# Patient Record
Sex: Female | Born: 1960 | Race: White | Hispanic: No | Marital: Married | Smoking: Former smoker
Health system: Southern US, Community
[De-identification: ages and names within clinical notes are randomized; demographics above are authoritative.]

## PROBLEM LIST (undated history)

## (undated) HISTORY — PX: WRIST FRACTURE SURGERY: SHX121

---

## 1998-06-21 ENCOUNTER — Other Ambulatory Visit: Admission: RE | Admit: 1998-06-21 | Discharge: 1998-06-21 | Payer: Self-pay | Admitting: Obstetrics and Gynecology

## 1998-08-08 ENCOUNTER — Other Ambulatory Visit: Admission: RE | Admit: 1998-08-08 | Discharge: 1998-08-08 | Payer: Self-pay | Admitting: Obstetrics and Gynecology

## 2000-01-16 ENCOUNTER — Other Ambulatory Visit: Admission: RE | Admit: 2000-01-16 | Discharge: 2000-01-16 | Payer: Self-pay | Admitting: Obstetrics and Gynecology

## 2001-01-22 ENCOUNTER — Other Ambulatory Visit: Admission: RE | Admit: 2001-01-22 | Discharge: 2001-01-22 | Payer: Self-pay | Admitting: Obstetrics and Gynecology

## 2002-03-22 ENCOUNTER — Other Ambulatory Visit: Admission: RE | Admit: 2002-03-22 | Discharge: 2002-03-22 | Payer: Self-pay | Admitting: Obstetrics and Gynecology

## 2003-04-03 ENCOUNTER — Other Ambulatory Visit: Admission: RE | Admit: 2003-04-03 | Discharge: 2003-04-03 | Payer: Self-pay | Admitting: Obstetrics and Gynecology

## 2004-05-30 ENCOUNTER — Other Ambulatory Visit: Admission: RE | Admit: 2004-05-30 | Discharge: 2004-05-30 | Payer: Self-pay | Admitting: Obstetrics and Gynecology

## 2005-06-11 ENCOUNTER — Other Ambulatory Visit: Admission: RE | Admit: 2005-06-11 | Discharge: 2005-06-11 | Payer: Self-pay | Admitting: Obstetrics and Gynecology

## 2011-01-29 ENCOUNTER — Emergency Department (INDEPENDENT_AMBULATORY_CARE_PROVIDER_SITE_OTHER): Payer: BC Managed Care – PPO

## 2011-01-29 ENCOUNTER — Emergency Department (HOSPITAL_BASED_OUTPATIENT_CLINIC_OR_DEPARTMENT_OTHER)
Admission: EM | Admit: 2011-01-29 | Discharge: 2011-01-29 | Disposition: A | Discharge status: Home / Self Care | Payer: BC Managed Care – PPO | Attending: Emergency Medicine | Admitting: Emergency Medicine

## 2011-01-29 DIAGNOSIS — S92309A Fracture of unspecified metatarsal bone(s), unspecified foot, initial encounter for closed fracture: Secondary | ICD-10-CM | POA: Insufficient documentation

## 2011-01-29 DIAGNOSIS — Y9301 Activity, walking, marching and hiking: Secondary | ICD-10-CM

## 2011-01-29 DIAGNOSIS — X500XXA Overexertion from strenuous movement or load, initial encounter: Secondary | ICD-10-CM

## 2013-04-14 ENCOUNTER — Ambulatory Visit (INDEPENDENT_AMBULATORY_CARE_PROVIDER_SITE_OTHER): Payer: BC Managed Care – PPO | Admitting: Obstetrics and Gynecology

## 2013-04-14 ENCOUNTER — Encounter: Payer: Self-pay | Admitting: Obstetrics and Gynecology

## 2013-04-14 VITALS — BP 120/70 | Ht 64.0 in | Wt 138.0 lb

## 2013-04-14 DIAGNOSIS — Z23 Encounter for immunization: Secondary | ICD-10-CM

## 2013-04-14 DIAGNOSIS — Z01419 Encounter for gynecological examination (general) (routine) without abnormal findings: Secondary | ICD-10-CM

## 2013-04-14 DIAGNOSIS — Z1211 Encounter for screening for malignant neoplasm of colon: Secondary | ICD-10-CM

## 2013-04-14 DIAGNOSIS — Z0000 Encounter for general adult medical examination without abnormal findings: Secondary | ICD-10-CM

## 2013-04-14 LAB — POCT URINALYSIS DIPSTICK
Blood, UA: NEGATIVE
Protein, UA: NEGATIVE
Urobilinogen, UA: NEGATIVE
pH, UA: 5

## 2013-04-14 LAB — COMPREHENSIVE METABOLIC PANEL
AST: 16 U/L (ref 0–37)
Albumin: 4.2 g/dL (ref 3.5–5.2)
Alkaline Phosphatase: 56 U/L (ref 39–117)
Potassium: 4.2 mEq/L (ref 3.5–5.3)
Sodium: 138 mEq/L (ref 135–145)
Total Bilirubin: 0.5 mg/dL (ref 0.3–1.2)
Total Protein: 6.7 g/dL (ref 6.0–8.3)

## 2013-04-14 LAB — CBC
Hemoglobin: 14.3 g/dL (ref 12.0–15.0)
MCH: 30.6 pg (ref 26.0–34.0)
MCHC: 33.9 g/dL (ref 30.0–36.0)
MCV: 90.4 fL (ref 78.0–100.0)
Platelets: 274 10*3/uL (ref 150–400)

## 2013-04-14 LAB — LIPID PANEL
LDL Cholesterol: 123 mg/dL — ABNORMAL HIGH (ref 0–99)
VLDL: 10 mg/dL (ref 0–40)

## 2013-04-14 MED ORDER — ESTRADIOL-LEVONORGESTREL 0.045-0.015 MG/DAY TD PTWK: 1.0000 | MEDICATED_PATCH | TRANSDERMAL | Status: DC

## 2013-04-14 NOTE — Progress Notes (Signed)
Patient ID: Danielle Shaffer, female   DOB: Oct 20, 1961, 52 y.o.   MRN: [id number] 52 y.o.   Married    Caucasian   female   G2P1011   here for annual exam.   Patient is on ClimaraPro.  Feels better on HRT.  Has tried to stop but had significant hot flashes.  Feels body shape is changing, especially when was off HRT for a brief time. Asking for a name of a Dermatology group for a nonurgent consultation.  No LMP recorded. Patient is postmenopausal.          Sexually active: yes  The current method of family planning is post menopausal status.    Exercising: weights and cardio Last mammogram:  03/2012 wnl: [facility] Last pap smear: 03/2012 wnl History of abnormal pap: Yes, 30 years ago: no treatment to cervix Smoking: no Alcohol: 1-2 beers per week Last colonoscopy: never Last Bone Density:  never Last tetanus shot: unsure Last cholesterol check: 01/2013 wnl  Hgb:                Urine: Neg   Family History  Problem Relation Age of Onset  . Hyperlipidemia Mother   . Diabetes Father   . Heart attack Father     There are no active problems to display for this patient.   History reviewed. No pertinent past medical history.  Past Surgical History  Procedure Laterality Date  . Wrist fracture surgery Left     has steel rod    Allergies: Bee venom  Current Outpatient Prescriptions  Medication Sig Dispense Refill  . CLIMARA PRO 0.045-0.015 MG/DAY Place 1 patch onto the skin once a week.       No current facility-administered medications for this visit.    ROS: Pertinent items are noted in HPI.  Social Hx:  Married.  Works in Air[employer].  Daughter 23 years old.  Exam:    BP 120/70  Ht 5\' 4"  (1.626 m)  Wt 138 lb (62.596 kg)  BMI 23.68 kg/m2   Wt Readings from Last 3 Encounters:  04/14/13 138 lb (62.596 kg)     Ht Readings from Last 3 Encounters:  04/14/13 5\' 4"  (1.626 m)    General appearance: alert, cooperative and appears stated age Head: Normocephalic, without  obvious abnormality, atraumatic Neck: no adenopathy, supple, symmetrical, trachea midline and thyroid not enlarged, symmetric, no tenderness/mass/nodules Lungs: clear to auscultation bilaterally Breasts: Inspection negative, No nipple retraction or dimpling, No nipple discharge or bleeding, No axillary or supraclavicular adenopathy, Normal to palpation without dominant masses Heart: regular rate and rhythm Abdomen: soft, non-tender;  no masses,  no organomegaly Extremities: extremities normal, atraumatic, no cyanosis or edema Skin: Skin color, texture, turgor normal. No rashes or lesions Lymph nodes: Cervical, supraclavicular, and axillary nodes normal. No abnormal inguinal nodes palpated Neurologic: Grossly normal   Pelvic: External genitalia:  no lesions              Urethra:  normal appearing urethra with no masses, tenderness or lesions              Bartholins and Skenes: normal                 Vagina: normal appearing vagina with normal color and discharge, no lesions              Cervix: normal appearance              Pap taken: yes and high risk HPV  Bimanual Exam:  Uterus:  uterus is normal size, shape, consistency and nontender                                      Adnexa: normal adnexa in size, nontender and no masses                                      Rectovaginal: Confirms rectal polyp or hemorrhoid                                      Anus:  normal sphincter tone.  A: normal menopausal exam HRT patient Rectal hemorrhoid or polyp Needs colonoscopy.    Due for tetanus vaccine.     P: mammogram at [facility].  Patient will call to make appointment.   pap smear and high risk HPV testing Rx for ClimaraPro.  See Epic orders.  Risks and benefits discussed. Referral to Dr. Loreta Ave for colonoscopy Gave patient name of [facility] so she can call to make an appointment. Lipid profile, CBC, CMP, TSH. TDap vaccine. return annually or prn     An After Visit  Summary was printed and given to the patient.

## 2013-04-14 NOTE — Addendum Note (Signed)
Addended by: Conley Simmonds on: 04/14/2013 02:02 PM   Modules accepted: Orders

## 2013-04-14 NOTE — Patient Instructions (Signed)

## 2013-04-18 ENCOUNTER — Encounter: Payer: Self-pay | Admitting: Obstetrics and Gynecology

## 2013-04-18 ENCOUNTER — Telehealth: Payer: Self-pay | Admitting: Orthopedic Surgery

## 2013-04-18 NOTE — Telephone Encounter (Signed)
Spoke with pt about appt with Dr. Loreta Ave 04-21-13 at 9:45 for consult for colonoscopy. Pt agreeable.

## 2013-04-21 LAB — IPS PAP TEST WITH HPV

## 2013-04-25 ENCOUNTER — Telehealth: Payer: Self-pay

## 2013-04-25 NOTE — Telephone Encounter (Signed)
LMOVM to call for test results. 

## 2013-04-25 NOTE — Telephone Encounter (Signed)
Patient notified of pap results and lab results.  She will begin low cholesterol diet and follow-up with PCP regarding elevated Cholesterol.

## 2013-04-25 NOTE — Telephone Encounter (Signed)
Message copied by Alphonsa Overall on Mon Apr 25, 2013  9:04 AM ------      Message from: Douglass Rivers      Created: Thu Apr 21, 2013 11:47 AM       Ascus, neg HRHPV, cotest 3y, #2 ------

## 2013-05-31 ENCOUNTER — Ambulatory Visit
Admission: RE | Admit: 2013-05-31 | Discharge: 2013-05-31 | Disposition: A | Discharge status: Home / Self Care | Payer: BC Managed Care – PPO | Source: Ambulatory Visit | Attending: Obstetrics and Gynecology | Admitting: Obstetrics and Gynecology

## 2013-05-31 DIAGNOSIS — Z01419 Encounter for gynecological examination (general) (routine) without abnormal findings: Secondary | ICD-10-CM

## 2013-09-22 ENCOUNTER — Other Ambulatory Visit: Payer: Self-pay

## 2014-04-17 ENCOUNTER — Encounter: Payer: Self-pay | Admitting: Obstetrics and Gynecology

## 2014-04-17 ENCOUNTER — Ambulatory Visit (INDEPENDENT_AMBULATORY_CARE_PROVIDER_SITE_OTHER): Payer: BC Managed Care – PPO | Admitting: Obstetrics and Gynecology

## 2014-04-17 ENCOUNTER — Ambulatory Visit: Payer: BC Managed Care – PPO | Admitting: Obstetrics and Gynecology

## 2014-04-17 VITALS — BP 120/62 | HR 76 | Ht 63.5 in | Wt 136.4 lb

## 2014-04-17 DIAGNOSIS — Z01419 Encounter for gynecological examination (general) (routine) without abnormal findings: Secondary | ICD-10-CM

## 2014-04-17 MED ORDER — ESTRADIOL-LEVONORGESTREL 0.045-0.015 MG/DAY TD PTWK: 1.0000 | MEDICATED_PATCH | TRANSDERMAL | Status: DC

## 2014-04-17 NOTE — Progress Notes (Signed)
Patient ID: Danielle Shaffer, female   DOB: 02-Jul-1961, 53 y.o.   MRN: [id number] GYNECOLOGY VISIT  PCP:   None  Referring provider:   HPI: 53 y.o.   Married  Caucasian  female   G2P1011 with Patient's last menstrual period was 11/17/2005.   here for  AEX.  Using ClimaraPro. Wants to wean off in the fall.   Daughter going to [place] in the fall. Daughter going to [place] this summer.   Hgb:  Labs at work.  Urine:  Neg  GYNECOLOGIC HISTORY: Patient's last menstrual period was 11/17/2005. Sexually active:  yes Partner preference: female Contraception:  postmenopausal  Menopausal hormone therapy: ClimaraPro patch DES exposure:  no Blood transfusions:  no Sexually transmitted diseases:    GYN procedures and prior surgeries:  no Last mammogram:  05-30-13 wnl:[facility].              Last pap and high risk HPV testing:  Ascus:neg HR HPV - 04/14/14 History of abnormal pap smear:  Yes, 31 years ago:no treatment of cervix.   OB History   Grav Para Term Preterm Abortions TAB SAB Ect Mult Living   2 1 1  1  1   1        LIFESTYLE: Exercise:   Weight training/cross training         Tobacco:   no Alcohol:     1-2 drinks per week Drug use:  no  OTHER HEALTH MAINTENANCE: Tetanus/TDap:  04-14-13 Gardisil:             n/a Influenza:           never Zostavax:          n/a  Bone density:    n/a Colonoscopy:    Had unsedated colonoscopy 1994:wnl.  Has not scheduled screening colonoscopy yet.  Cholesterol check:  Wnl:6 months ago.  Does wellness check at work.    Family History  Problem Relation Age of Onset  . Hyperlipidemia Mother   . Diabetes Father   . Heart attack Father     There are no active problems to display for this patient.  History reviewed. No pertinent past medical history.  Past Surgical History  Procedure Laterality Date  . Wrist fracture surgery Left     has steel rod    ALLERGIES: Bee venom  Current Outpatient Prescriptions  Medication Sig  Dispense Refill  . estradiol-levonorgestrel (CLIMARA PRO) 0.045-0.015 MG/DAY Place 1 patch onto the skin once a week.  4 patch  11  . loratadine-pseudoephedrine (CLARITIN-D 24-HOUR) 10-240 MG per 24 hr tablet Take 1 tablet by mouth daily.       No current facility-administered medications for this visit.     ROS:  Pertinent items are noted in HPI.  SOCIAL HISTORY:  Sales.   PHYSICAL EXAMINATION:    BP 120/62  Pulse 76  Ht 5' 3.5" (1.613 m)  Wt 136 lb 6.4 oz (61.871 kg)  BMI 23.78 kg/m2  LMP 11/17/2005   Wt Readings from Last 3 Encounters:  04/17/14 136 lb 6.4 oz (61.871 kg)  04/14/13 138 lb (62.596 kg)     Ht Readings from Last 3 Encounters:  04/17/14 5' 3.5" (1.613 m)  04/14/13 5\' 4"  (1.626 m)    General appearance: alert, cooperative and appears stated age Head: Normocephalic, without obvious abnormality, atraumatic Neck: no adenopathy, supple, symmetrical, trachea midline and thyroid not enlarged, symmetric, no tenderness/mass/nodules Lungs: clear to auscultation bilaterally Breasts: Inspection negative, No nipple retraction or  dimpling, No nipple discharge or bleeding, No axillary or supraclavicular adenopathy, Normal to palpation without dominant masses Heart: regular rate and rhythm Abdomen: soft, non-tender; no masses,  no organomegaly Extremities: extremities normal, atraumatic, no cyanosis or edema Skin: Skin color, texture, turgor normal. No rashes or lesions Lymph nodes: Cervical, supraclavicular, and axillary nodes normal. No abnormal inguinal nodes palpated Neurologic: Grossly normal  Pelvic: External genitalia:  no lesions              Urethra:  normal appearing urethra with no masses, tenderness or lesions              Bartholins and Skenes: normal                 Vagina: normal appearing vagina with normal color and discharge, no lesions              Cervix: normal appearance              Pap and high risk HPV testing done: no.            Bimanual Exam:   Uterus:  uterus is normal size, shape, consistency and nontender                                      Adnexa: normal adnexa in size, nontender and no masses                                      Rectovaginal: Confirms                                      Anus:  normal sphincter tone, no lesions  ASSESSMENT  Normal gynecologic exam. ASCUS Pap with negative HR HPV in 2014 HRT patient.  Desires to continue but may wean off in the fall 2015.   PLAN  Mammogram recommended yearly.  Pap smear and high risk HPV testing in 2017. Counseled on self breast exam, Calcium and vitamin D intake, exercise.  ClimaraPro Rx for 12 months.  I discussed risks of DVT, PE, MI, stroke and breast cancer.   Colonoscopy with [facility] this year.  Return annually or prn   An After Visit Summary was printed and given to the patient.

## 2014-04-17 NOTE — Patient Instructions (Signed)

## 2014-09-18 ENCOUNTER — Encounter: Payer: Self-pay | Admitting: Obstetrics and Gynecology

## 2015-04-19 ENCOUNTER — Ambulatory Visit (INDEPENDENT_AMBULATORY_CARE_PROVIDER_SITE_OTHER): Payer: BLUE CROSS/BLUE SHIELD | Admitting: Obstetrics and Gynecology

## 2015-04-19 ENCOUNTER — Other Ambulatory Visit: Payer: Self-pay

## 2015-04-19 ENCOUNTER — Other Ambulatory Visit: Payer: Self-pay | Admitting: Obstetrics and Gynecology

## 2015-04-19 ENCOUNTER — Encounter: Payer: Self-pay | Admitting: Obstetrics and Gynecology

## 2015-04-19 VITALS — BP 128/78 | HR 72 | Resp 16 | Ht 63.25 in | Wt 139.0 lb

## 2015-04-19 DIAGNOSIS — Z1211 Encounter for screening for malignant neoplasm of colon: Secondary | ICD-10-CM | POA: Diagnosis not present

## 2015-04-19 DIAGNOSIS — Z634 Disappearance and death of family member: Secondary | ICD-10-CM

## 2015-04-19 DIAGNOSIS — Z1231 Encounter for screening mammogram for malignant neoplasm of breast: Secondary | ICD-10-CM

## 2015-04-19 DIAGNOSIS — R5383 Other fatigue: Secondary | ICD-10-CM | POA: Diagnosis not present

## 2015-04-19 DIAGNOSIS — Z01419 Encounter for gynecological examination (general) (routine) without abnormal findings: Secondary | ICD-10-CM

## 2015-04-19 DIAGNOSIS — Z0000 Encounter for general adult medical examination without abnormal findings: Secondary | ICD-10-CM | POA: Diagnosis not present

## 2015-04-19 LAB — POCT URINALYSIS DIPSTICK
BILIRUBIN UA: NEGATIVE
Blood, UA: NEGATIVE
Glucose, UA: NEGATIVE
KETONES UA: NEGATIVE
LEUKOCYTES UA: NEGATIVE
Nitrite, UA: NEGATIVE
PROTEIN UA: NEGATIVE
Spec Grav, UA: 1.02
UROBILINOGEN UA: NEGATIVE
pH, UA: 6.5

## 2015-04-19 MED ORDER — SERTRALINE HCL 50 MG PO TABS: 50.0000 mg | ORAL_TABLET | Freq: Every day | ORAL | Status: DC

## 2015-04-19 MED ORDER — ESTRADIOL-LEVONORGESTREL 0.045-0.015 MG/DAY TD PTWK: 1.0000 | MEDICATED_PATCH | TRANSDERMAL | Status: DC

## 2015-04-19 NOTE — Patient Instructions (Signed)

## 2015-04-19 NOTE — Progress Notes (Signed)
Patient is scheduled for Bilateral Screening Mammogram  at [facility] for 04/26/15 at 0840. Patient agreeable to time/date/location.

## 2015-04-19 NOTE — Progress Notes (Signed)
Patient ID: Danielle Shaffer, female   DOB: 09-27-1961, 54 y.o.   MRN: [id number] 54 y.o. G44P1011 Married Caucasian female here for annual exam.    Stress from losses - mother and dog died.  Gave up horse that was important to family.  States it is all too much at one time.  Decreased energy.  Not exercising.  Denies suicidal ideation.   Normal cholesterol in fall 2015.  Going on a cruise for 7 days in August.   PCP:   NON PCP  Patient's last menstrual period was 11/17/2005.          Sexually active: Yes.    The current method of family planning is post menopausal status.    Exercising: Yes.    tennis & walking Smoker:  no  Health Maintenance: Pap:  04-14-13 ASCUS NEG HR HPV History of abnormal Pap:  Yes.  See above.  Also history of remote abnormal pap in 1980s.  MMG:  05-31-13 WNL Heterogeneously dense  Colonoscopy:  1994- WNL BMD:   Never TDaP:  04-14-13 Screening Labs: Employer does fasting labs, Urine today: normal.   reports that she has never smoked. She does not have any smokeless tobacco history on file. She reports that she drinks alcohol. She reports that she does not use illicit drugs.  No past medical history on file.  Past Surgical History  Procedure Laterality Date  . Wrist fracture surgery Left     has steel rod    Current Outpatient Prescriptions  Medication Sig Dispense Refill  . estradiol-levonorgestrel (CLIMARA PRO) 0.045-0.015 MG/DAY Place 1 patch onto the skin once a week. 4 patch 11  . loratadine-pseudoephedrine (CLARITIN-D 24-HOUR) 10-240 MG per 24 hr tablet Take 1 tablet by mouth daily.     No current facility-administered medications for this visit.    Family History  Problem Relation Age of Onset  . Hyperlipidemia Mother   . Diabetes Father   . Heart attack Father     ROS:  Pertinent items are noted in HPI.  Otherwise, a comprehensive ROS was negative.  Exam:   LMP 11/17/2005    General appearance: alert, cooperative and appears stated  age Head: Normocephalic, without obvious abnormality, atraumatic Neck: no adenopathy, supple, symmetrical, trachea midline and thyroid normal to inspection and palpation Lungs: clear to auscultation bilaterally Breasts: normal appearance, no masses or tenderness, Inspection negative, No nipple retraction or dimpling, No nipple discharge or bleeding, No axillary or supraclavicular adenopathy Heart: regular rate and rhythm Abdomen: soft, non-tender; bowel sounds normal; no masses,  no organomegaly Extremities: extremities normal, atraumatic, no cyanosis or edema Skin: Skin color, texture, turgor normal. No rashes or lesions Lymph nodes: Cervical, supraclavicular, and axillary nodes normal. No abnormal inguinal nodes palpated Neurologic: Grossly normal  Pelvic: External genitalia:  no lesions              Urethra:  normal appearing urethra with no masses, tenderness or lesions              Bartholins and Skenes: normal                 Vagina: normal appearing vagina with normal color and discharge, no lesions              Cervix: no lesions              Pap taken: No. Bimanual Exam:  Uterus:  normal size, contour, position, consistency, mobility, non-tender  Adnexa: normal adnexa and no mass, fullness, tenderness              Rectovaginal: Yes.  .  Confirms.              Anus:  normal sphincter tone, no lesions  Chaperone was present for exam.  Assessment:   Well woman visit with normal exam. Hx of ASCUS and negative HR HPV in 2014.  Bereavement.  Fatigue.   Plan: Yearly mammogram recommended after age 54.  Will schedule for the patient.  Recommended self breast exam.  Pap and HR HPV as above. MVI.  Labs performed.  Yes.  .   See orders.  Routine labs. Referral to GI for colonoscopy.  Refills given on medications.  Yes.  .  See orders. One month of Combipatch until mammogram back and normal.  Referral to Berniece AndreasJulie Whitt, counselor.  Card given so patient can call for  appointment.  Zoloft 50 mg daily.  #30, RF 3.  Follow up in 3 months, sooner as needed.  Follow up annually and prn.   After visit summary provided.

## 2015-04-20 LAB — COMPREHENSIVE METABOLIC PANEL
ALT: 8 U/L (ref 0–35)
AST: 12 U/L (ref 0–37)
Albumin: 4.1 g/dL (ref 3.5–5.2)
Alkaline Phosphatase: 40 U/L (ref 39–117)
BILIRUBIN TOTAL: 0.3 mg/dL (ref 0.2–1.2)
BUN: 14 mg/dL (ref 6–23)
CO2: 25 meq/L (ref 19–32)
CREATININE: 0.69 mg/dL (ref 0.50–1.10)
Calcium: 9.1 mg/dL (ref 8.4–10.5)
Chloride: 105 mEq/L (ref 96–112)
Glucose, Bld: 92 mg/dL (ref 70–99)
POTASSIUM: 4.1 meq/L (ref 3.5–5.3)
SODIUM: 137 meq/L (ref 135–145)
Total Protein: 6.6 g/dL (ref 6.0–8.3)

## 2015-04-20 LAB — CBC
HEMATOCRIT: 42.4 % (ref 36.0–46.0)
HEMOGLOBIN: 14.1 g/dL (ref 12.0–15.0)
MCH: 30.7 pg (ref 26.0–34.0)
MCHC: 33.3 g/dL (ref 30.0–36.0)
MCV: 92.2 fL (ref 78.0–100.0)
MPV: 9.9 fL (ref 8.6–12.4)
Platelets: 247 10*3/uL (ref 150–400)
RBC: 4.6 MIL/uL (ref 3.87–5.11)
RDW: 13.8 % (ref 11.5–15.5)
WBC: 5.6 10*3/uL (ref 4.0–10.5)

## 2015-04-20 LAB — TSH: TSH: 1.901 u[IU]/mL (ref 0.350–4.500)

## 2015-04-20 LAB — VITAMIN D 25 HYDROXY (VIT D DEFICIENCY, FRACTURES): VIT D 25 HYDROXY: 30 ng/mL (ref 30–100)

## 2015-04-26 ENCOUNTER — Ambulatory Visit
Admission: RE | Admit: 2015-04-26 | Discharge: 2015-04-26 | Disposition: A | Discharge status: Home / Self Care | Payer: BLUE CROSS/BLUE SHIELD | Source: Ambulatory Visit | Attending: Obstetrics and Gynecology | Admitting: Obstetrics and Gynecology

## 2015-04-26 DIAGNOSIS — Z1231 Encounter for screening mammogram for malignant neoplasm of breast: Secondary | ICD-10-CM

## 2015-04-27 ENCOUNTER — Ambulatory Visit: Payer: BC Managed Care – PPO | Admitting: Obstetrics and Gynecology

## 2015-05-16 ENCOUNTER — Other Ambulatory Visit: Payer: Self-pay | Admitting: Obstetrics and Gynecology

## 2015-05-16 NOTE — Telephone Encounter (Signed)
Medication refill request: Climara Pro Patch  Last AEX:  04/19/15 with BS Next AEX: 04/24/16 with BS Last MMG (if hormonal medication request): 04/26/15 bi-rads 1; negative Refill authorized: #4/11 rfs  Patient was given one month's worth at AEX until mammogram was back and reported. MMG done 04/26/15 bi-rads 1: neg okay to send refills in x 1 year?

## 2015-07-20 ENCOUNTER — Ambulatory Visit: Payer: BLUE CROSS/BLUE SHIELD | Admitting: Obstetrics and Gynecology

## 2015-07-24 ENCOUNTER — Telehealth: Payer: Self-pay | Admitting: Obstetrics and Gynecology

## 2015-07-24 NOTE — Telephone Encounter (Signed)
Thank you for the update.  I closed the encounter. 

## 2015-07-24 NOTE — Telephone Encounter (Signed)
Patient canceled her appointment for 3 mo reck. She states she is on my chart and will send a message to Dr. Edward Jolly. She will call back to reschedule later.

## 2015-07-25 ENCOUNTER — Ambulatory Visit: Payer: BLUE CROSS/BLUE SHIELD | Admitting: Obstetrics and Gynecology

## 2015-08-13 IMAGING — MG MM SCREEN MAMMOGRAM BILATERAL
4 series · 4 of 4 positions shown · non-contrast
Comparison: Previous exam(s).

CLINICAL DATA: Screening.

EXAM:
DIGITAL SCREENING BILATERAL MAMMOGRAM WITH CAD

[R CC]
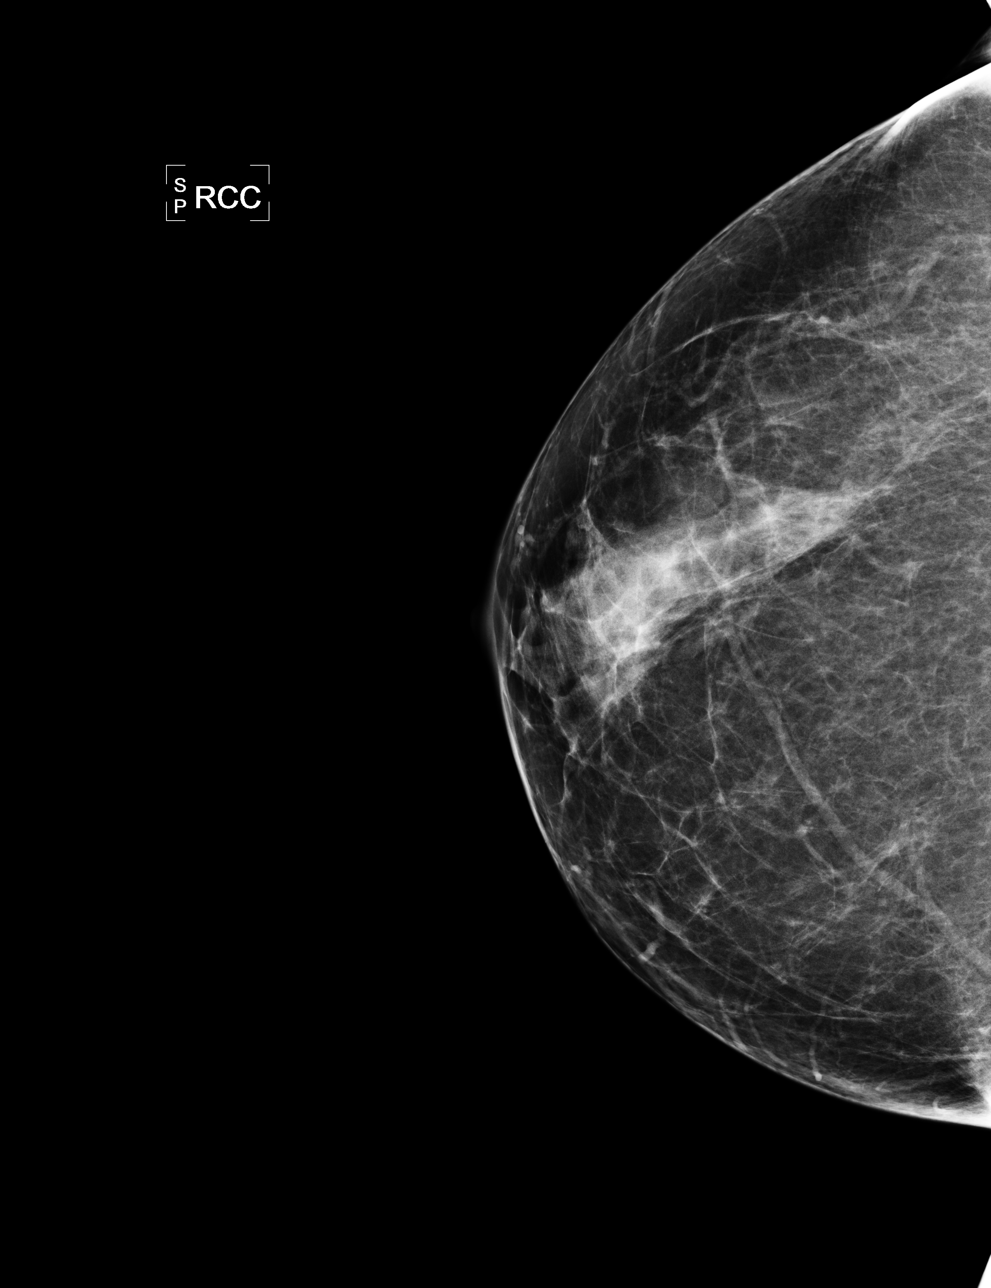

[L CC]
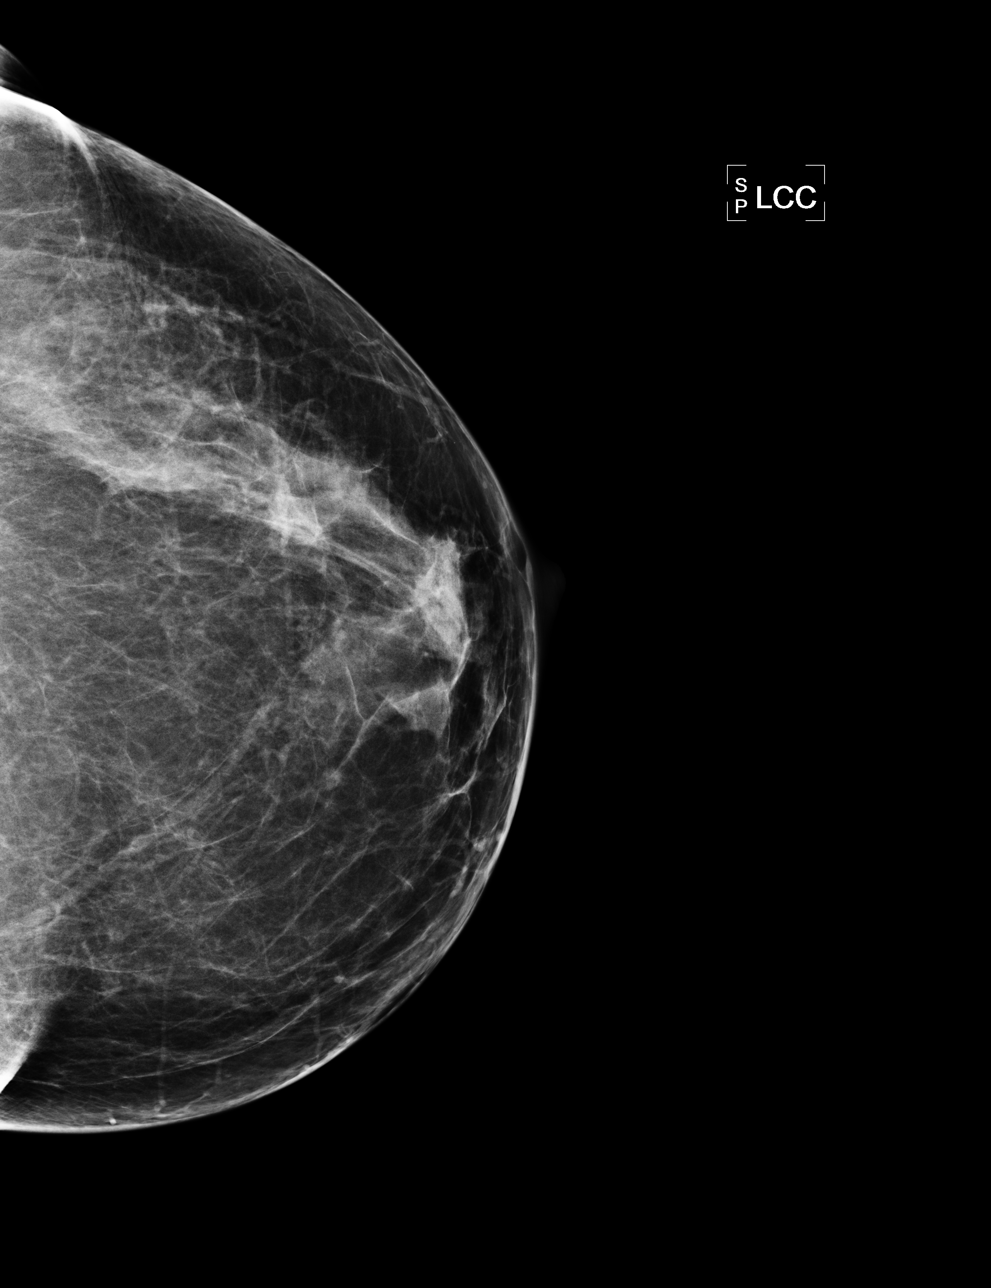

[L MLO]
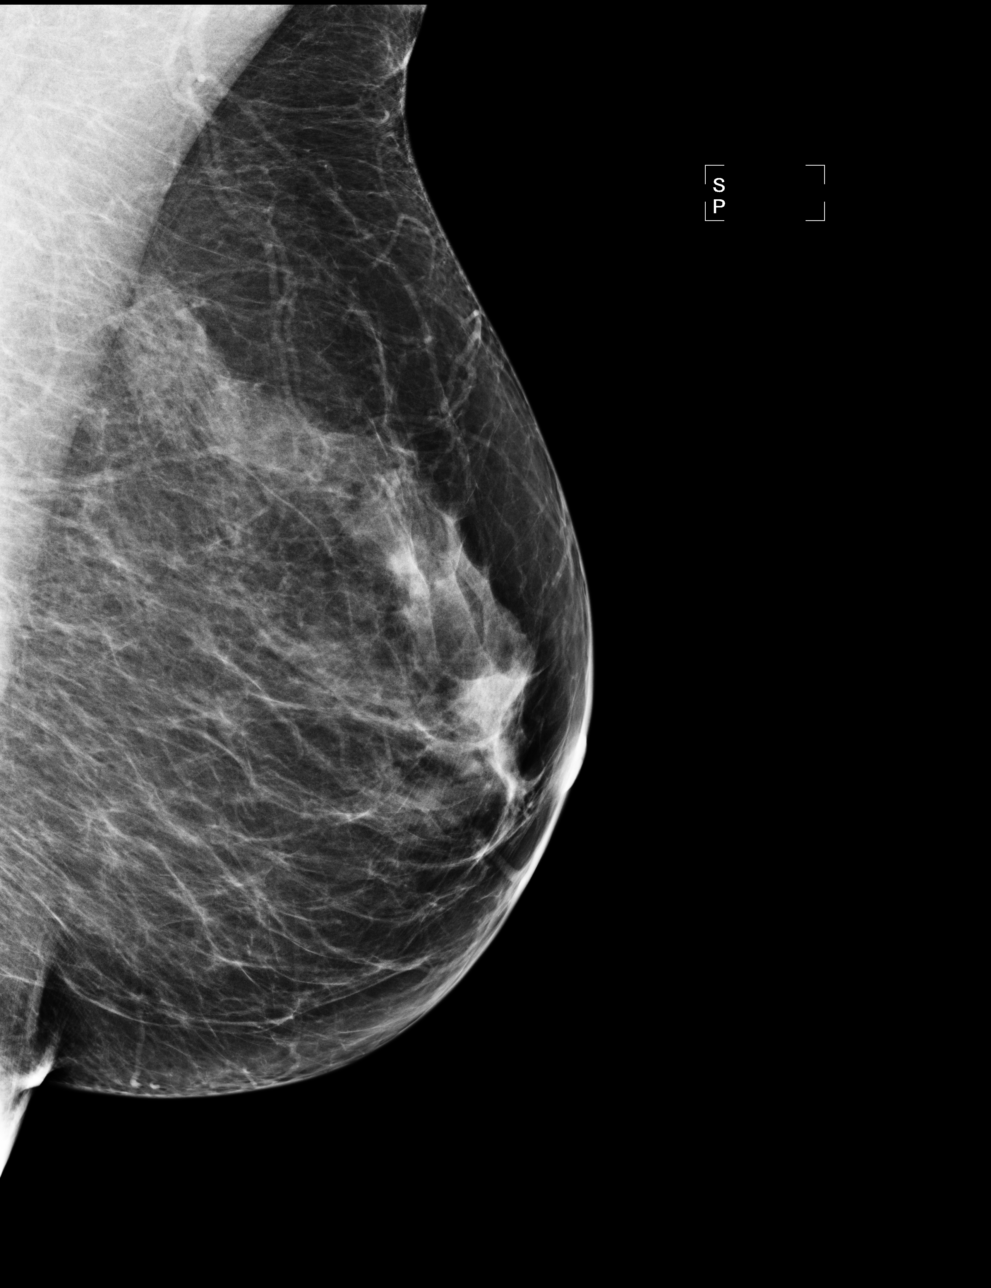

[R MLO]
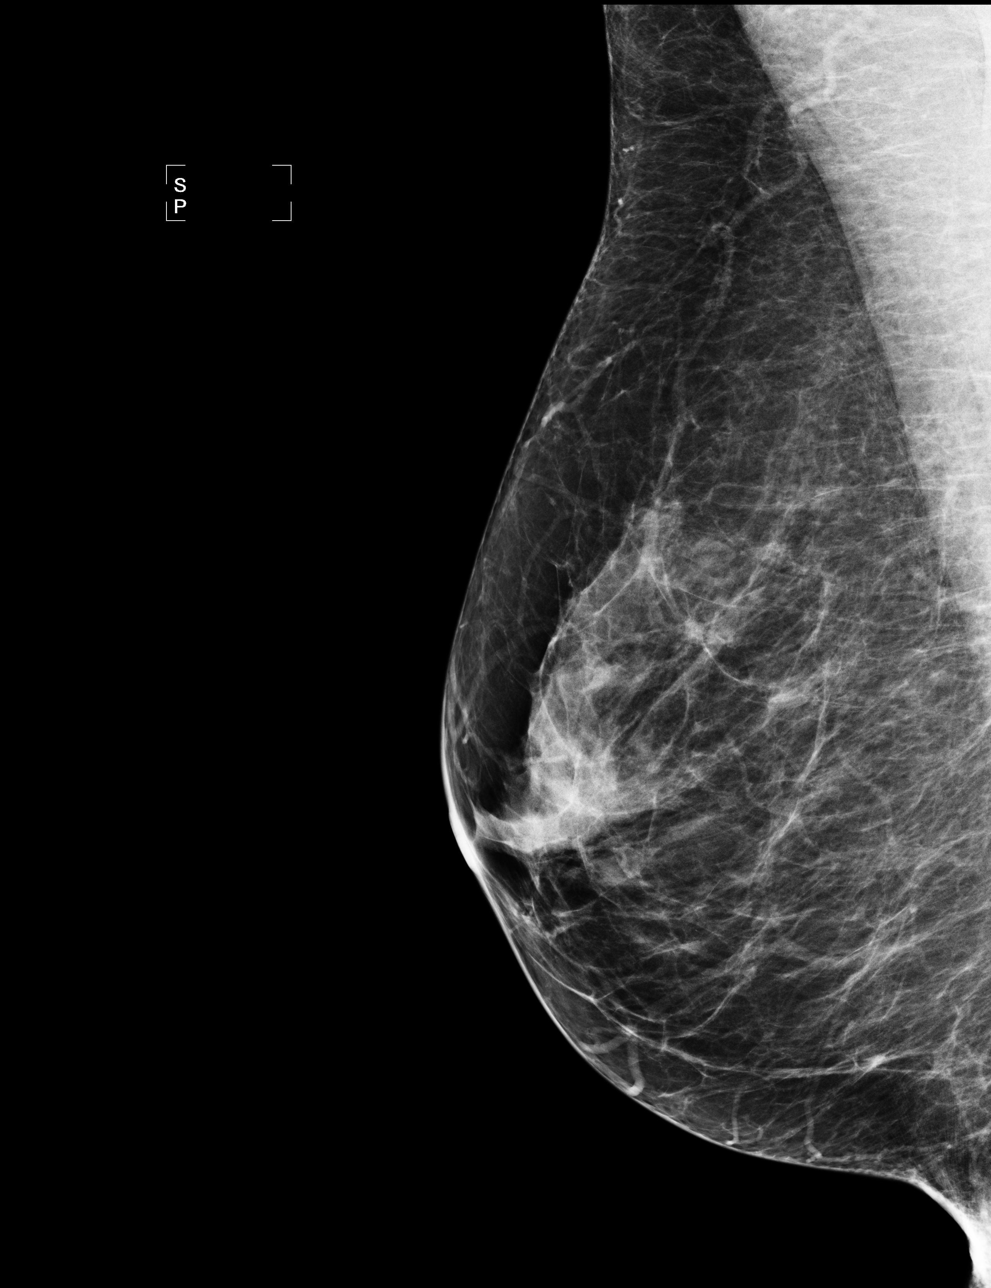

[4 of 4 positions shown; findings below may reference images not displayed]

ACR Breast Density Category b: There are scattered areas of
fibroglandular density.
FINDINGS: There are no findings suspicious for malignancy. Images were
processed with CAD.
IMPRESSION: No mammographic evidence of malignancy. A result letter of this
screening mammogram will be mailed directly to the patient.

RECOMMENDATION:
Screening mammogram in one year. (Code:AS-G-LCT)

BI-RADS CATEGORY  1: Negative.

## 2016-04-24 ENCOUNTER — Ambulatory Visit: Payer: BLUE CROSS/BLUE SHIELD | Admitting: Obstetrics and Gynecology

## 2016-05-07 ENCOUNTER — Encounter: Payer: Self-pay | Admitting: Obstetrics and Gynecology

## 2016-05-07 ENCOUNTER — Ambulatory Visit (INDEPENDENT_AMBULATORY_CARE_PROVIDER_SITE_OTHER): Payer: BLUE CROSS/BLUE SHIELD | Admitting: Obstetrics and Gynecology

## 2016-05-07 VITALS — BP 122/64 | HR 84 | Resp 22

## 2016-05-07 DIAGNOSIS — R319 Hematuria, unspecified: Secondary | ICD-10-CM | POA: Diagnosis not present

## 2016-05-07 DIAGNOSIS — Z113 Encounter for screening for infections with a predominantly sexual mode of transmission: Secondary | ICD-10-CM | POA: Diagnosis not present

## 2016-05-07 DIAGNOSIS — Z01419 Encounter for gynecological examination (general) (routine) without abnormal findings: Secondary | ICD-10-CM | POA: Diagnosis not present

## 2016-05-07 DIAGNOSIS — Z0000 Encounter for general adult medical examination without abnormal findings: Secondary | ICD-10-CM | POA: Diagnosis not present

## 2016-05-07 LAB — POCT URINALYSIS DIPSTICK
Bilirubin, UA: NEGATIVE
GLUCOSE UA: NEGATIVE
Ketones, UA: NEGATIVE
Leukocytes, UA: NEGATIVE
Nitrite, UA: NEGATIVE
Protein, UA: NEGATIVE
UROBILINOGEN UA: NEGATIVE
pH, UA: 5

## 2016-05-07 MED ORDER — ESTRADIOL-LEVONORGESTREL 0.045-0.015 MG/DAY TD PTWK: MEDICATED_PATCH | TRANSDERMAL | Status: DC

## 2016-05-07 NOTE — Patient Instructions (Signed)
Health Maintenance, Female Adopting a healthy lifestyle and getting preventive care can go a long way to promote health and wellness. Talk with your health care provider about what schedule of regular examinations is right for you. This is a good chance for you to check in with your provider about disease prevention and staying healthy. In between checkups, there are plenty of things you can do on your own. Experts have done a lot of research about which lifestyle changes and preventive measures are most likely to keep you healthy. Ask your health care provider for more information. WEIGHT AND DIET  Eat a healthy diet  Be sure to include plenty of vegetables, fruits, low-fat dairy products, and lean protein.  Do not eat a lot of foods high in solid fats, added sugars, or salt.  Get regular exercise. This is one of the most important things you can do for your health.  Most adults should exercise for at least 150 minutes each week. The exercise should increase your heart rate and make you sweat (moderate-intensity exercise).  Most adults should also do strengthening exercises at least twice a week. This is in addition to the moderate-intensity exercise.  Maintain a healthy weight  Body mass index (BMI) is a measurement that can be used to identify possible weight problems. It estimates body fat based on height and weight. Your health care provider can help determine your BMI and help you achieve or maintain a healthy weight.  For females 20 years of age and older:   A BMI below 18.5 is considered underweight.  A BMI of 18.5 to 24.9 is normal.  A BMI of 25 to 29.9 is considered overweight.  A BMI of 30 and above is considered obese.  Watch levels of cholesterol and blood lipids  You should start having your blood tested for lipids and cholesterol at 57 years of age, then have this test every 5 years.  You may need to have your cholesterol levels checked more often if:  Your lipid  or cholesterol levels are high.  You are older than 57 years of age.  You are at high risk for heart disease.  CANCER SCREENING   Lung Cancer  Lung cancer screening is recommended for adults 55-80 years old who are at high risk for lung cancer because of a history of smoking.  A yearly low-dose CT scan of the lungs is recommended for people who:  Currently smoke.  Have quit within the past 15 years.  Have at least a 30-pack-year history of smoking. A pack year is smoking an average of one pack of cigarettes a day for 1 year.  Yearly screening should continue until it has been 15 years since you quit.  Yearly screening should stop if you develop a health problem that would prevent you from having lung cancer treatment.  Breast Cancer  Practice breast self-awareness. This means understanding how your breasts normally appear and feel.  It also means doing regular breast self-exams. Let your health care provider know about any changes, no matter how small.  If you are in your 20s or 30s, you should have a clinical breast exam (CBE) by a health care provider every 1-3 years as part of a regular health exam.  If you are 40 or older, have a CBE every year. Also consider having a breast X-ray (mammogram) every year.  If you have a family history of breast cancer, talk to your health care provider about genetic screening.  If you   are at high risk for breast cancer, talk to your health care provider about having an MRI and a mammogram every year.  Breast cancer gene (BRCA) assessment is recommended for women who have family members with BRCA-related cancers. BRCA-related cancers include:  Breast.  Ovarian.  Tubal.  Peritoneal cancers.  Results of the assessment will determine the need for genetic counseling and BRCA1 and BRCA2 testing. Cervical Cancer Your health care provider may recommend that you be screened regularly for cancer of the pelvic organs (ovaries, uterus, and  vagina). This screening involves a pelvic examination, including checking for microscopic changes to the surface of your cervix (Pap test). You may be encouraged to have this screening done every 3 years, beginning at age 21.  For women ages 30-65, health care providers may recommend pelvic exams and Pap testing every 3 years, or they may recommend the Pap and pelvic exam, combined with testing for human papilloma virus (HPV), every 5 years. Some types of HPV increase your risk of cervical cancer. Testing for HPV may also be done on women of any age with unclear Pap test results.  Other health care providers may not recommend any screening for nonpregnant women who are considered low risk for pelvic cancer and who do not have symptoms. Ask your health care provider if a screening pelvic exam is right for you.  If you have had past treatment for cervical cancer or a condition that could lead to cancer, you need Pap tests and screening for cancer for at least 20 years after your treatment. If Pap tests have been discontinued, your risk factors (such as having a new sexual partner) need to be reassessed to determine if screening should resume. Some women have medical problems that increase the chance of getting cervical cancer. In these cases, your health care provider may recommend more frequent screening and Pap tests. Colorectal Cancer  This type of cancer can be detected and often prevented.  Routine colorectal cancer screening usually begins at 57 years of age and continues through 57 years of age.  Your health care provider may recommend screening at an earlier age if you have risk factors for colon cancer.  Your health care provider may also recommend using home test kits to check for hidden blood in the stool.  A small camera at the end of a tube can be used to examine your colon directly (sigmoidoscopy or colonoscopy). This is done to check for the earliest forms of colorectal  cancer.  Routine screening usually begins at age 50.  Direct examination of the colon should be repeated every 5-10 years through 57 years of age. However, you may need to be screened more often if early forms of precancerous polyps or small growths are found. Skin Cancer  Check your skin from head to toe regularly.  Tell your health care provider about any new moles or changes in moles, especially if there is a change in a mole's shape or color.  Also tell your health care provider if you have a mole that is larger than the size of a pencil eraser.  Always use sunscreen. Apply sunscreen liberally and repeatedly throughout the day.  Protect yourself by wearing long sleeves, pants, a wide-brimmed hat, and sunglasses whenever you are outside. HEART DISEASE, DIABETES, AND HIGH BLOOD PRESSURE   High blood pressure causes heart disease and increases the risk of stroke. High blood pressure is more likely to develop in:  People who have blood pressure in the high end   of the normal range (130-139/85-89 mm Hg).  People who are overweight or obese.  People who are African American.  If you are 38-23 years of age, have your blood pressure checked every 3-5 years. If you are 61 years of age or older, have your blood pressure checked every year. You should have your blood pressure measured twice--once when you are at a hospital or clinic, and once when you are not at a hospital or clinic. Record the average of the two measurements. To check your blood pressure when you are not at a hospital or clinic, you can use:  An automated blood pressure machine at a pharmacy.  A home blood pressure monitor.  If you are between 45 years and 39 years old, ask your health care provider if you should take aspirin to prevent strokes.  Have regular diabetes screenings. This involves taking a blood sample to check your fasting blood sugar level.  If you are at a normal weight and have a low risk for diabetes,  have this test once every three years after 57 years of age.  If you are overweight and have a high risk for diabetes, consider being tested at a younger age or more often. PREVENTING INFECTION  Hepatitis B  If you have a higher risk for hepatitis B, you should be screened for this virus. You are considered at high risk for hepatitis B if:  You were born in a country where hepatitis B is common. Ask your health care provider which countries are considered high risk.  Your parents were born in a high-risk country, and you have not been immunized against hepatitis B (hepatitis B vaccine).  You have HIV or AIDS.  You use needles to inject street drugs.  You live with someone who has hepatitis B.  You have had sex with someone who has hepatitis B.  You get hemodialysis treatment.  You take certain medicines for conditions, including cancer, organ transplantation, and autoimmune conditions. Hepatitis C  Blood testing is recommended for:  Everyone born from [birth year] through 1967.  Anyone with known risk factors for hepatitis C. Sexually transmitted infections (STIs)  You should be screened for sexually transmitted infections (STIs) including gonorrhea and chlamydia if:  You are sexually active and are younger than 57 years of age.  You are older than 57 years of age and your health care provider tells you that you are at risk for this type of infection.  Your sexual activity has changed since you were last screened and you are at an increased risk for chlamydia or gonorrhea. Ask your health care provider if you are at risk.  If you do not have HIV, but are at risk, it may be recommended that you take a prescription medicine daily to prevent HIV infection. This is called pre-exposure prophylaxis (PrEP). You are considered at risk if:  You are sexually active and do not regularly use condoms or know the HIV status of your partner(s).  You take drugs by injection.  You are sexually  active with a partner who has HIV. Talk with your health care provider about whether you are at high risk of being infected with HIV. If you choose to begin PrEP, you should first be tested for HIV. You should then be tested every 3 months for as long as you are taking PrEP.  PREGNANCY   If you are premenopausal and you may become pregnant, ask your health care provider about preconception counseling.  If you may  become pregnant, take 400 to 800 micrograms (mcg) of folic acid every day.  If you want to prevent pregnancy, talk to your health care provider about birth control (contraception). OSTEOPOROSIS AND MENOPAUSE   Osteoporosis is a disease in which the bones lose minerals and strength with aging. This can result in serious bone fractures. Your risk for osteoporosis can be identified using a bone density scan.  If you are 61 years of age or older, or if you are at risk for osteoporosis and fractures, ask your health care provider if you should be screened.  Ask your health care provider whether you should take a calcium or vitamin D supplement to lower your risk for osteoporosis.  Menopause may have certain physical symptoms and risks.  Hormone replacement therapy may reduce some of these symptoms and risks. Talk to your health care provider about whether hormone replacement therapy is right for you.  HOME CARE INSTRUCTIONS   Schedule regular health, dental, and eye exams.  Stay current with your immunizations.   Do not use any tobacco products including cigarettes, chewing tobacco, or electronic cigarettes.  If you are pregnant, do not drink alcohol.  If you are breastfeeding, limit how much and how often you drink alcohol.  Limit alcohol intake to no more than 1 drink per day for nonpregnant women. One drink equals 12 ounces of beer, 5 ounces of wine, or 1 ounces of hard liquor.  Do not use street drugs.  Do not share needles.  Ask your health care provider for help if  you need support or information about quitting drugs.  Tell your health care provider if you often feel depressed.  Tell your health care provider if you have ever been abused or do not feel safe at home.   This information is not intended to replace advice given to you by your health care provider. Make sure you discuss any questions you have with your health care provider.   Document Released: 05/19/2011 Document Revised: 11/24/2014 Document Reviewed: 10/05/2013 Elsevier Interactive Patient Education [employer].

## 2016-05-07 NOTE — Progress Notes (Signed)
Patient ID: Danielle Shaffer, female   DOB: 09/24/1961, 55 y.o.   MRN: [id number] 55 y.o. [id number] Married Caucasian female here for annual exam.    Working on weight loss.   On ClimaraPro.  Would like to continue.  Still having hot flashes if she stops.  Had right LE swelling 6 weeks ago.  Noted while she went to try on shoes. Had a doppler ultrasound which was negative for DVT.  Does labs at work once a year.  [facility] [place]  Patient's last menstrual period was 11/17/2005.           Sexually active: Yes.   female The current method of family planning is post menopausal status.    Exercising: Yes.    tennis, weights and treadmill. Smoker:  no  Health Maintenance: Pap:  04-14-13 ASCUS:Neg History of abnormal Pap:  Yes, Hx abnormal pap 1984 but no treatment to cervix.  04-14-13 ASCUS:Neg HR HPV MMG:  04-26-15 Density B/Neg/BiR[facility] Colonoscopy:  12/2015 normal with [facility];next due 12/2020 BMD:   n/a  Result  n/a TDaP:  04-14-13 Gardasil:   N/A HIV:  Negative in pregnancy. Hep C:  Today. Screening Labs:  Hb today: Labs thru employer, Urine today: 2+RBCs--asymptomatic   reports that she has never smoked. She has never used smokeless tobacco. She reports that she drinks about 1.8 - 3.0 oz of alcohol per week. She reports that she does not use illicit drugs.  History reviewed. No pertinent past medical history.  Past Surgical History  Procedure Laterality Date  . Wrist fracture surgery Left     has steel rod    Current Outpatient Prescriptions  Medication Sig Dispense Refill  . estradiol-levonorgestrel (CLIMARA PRO) 0.045-0.015 MG/DAY PLACE 1 PATCH ONTO THE SKIN ONCE A WEEK. 4 patch 11  . loratadine-pseudoephedrine (CLARITIN-D 24-HOUR) 10-240 MG per 24 hr tablet Take 1 tablet by mouth daily.     No current facility-administered medications for this visit.    Family History  Problem Relation Age of Onset  . Hyperlipidemia Mother   . Dementia Mother      Dec age 57  . Diabetes Father   . Heart attack Father     ROS:  Pertinent items are noted in HPI.  Otherwise, a comprehensive ROS was negative.  Exam:   BP 122/64 mmHg  Pulse 84  Resp 22  LMP 11/17/2005    General appearance: alert, cooperative and appears stated age Head: Normocephalic, without obvious abnormality, atraumatic Neck: no adenopathy, supple, symmetrical, trachea midline and thyroid normal to inspection and palpation Lungs: clear to auscultation bilaterally Breasts: normal appearance, no masses or tenderness, Inspection negative, No nipple retraction or dimpling, No nipple discharge or bleeding, No axillary or supraclavicular adenopathy Heart: regular rate and rhythm Abdomen: soft, non-tender; no masses, no organomegaly Extremities: extremities normal, atraumatic, no cyanosis or edema Skin: Skin color, texture, turgor normal. No rashes or lesions Lymph nodes: Cervical, supraclavicular, and axillary nodes normal. No abnormal inguinal nodes palpated Neurologic: Grossly normal  Pelvic: External genitalia:  no lesions              Urethra:  normal appearing urethra with no masses, tenderness or lesions              Bartholins and Skenes: normal                 Vagina: normal appearing vagina with normal color and discharge, no lesions  Cervix: no lesions              Pap taken: Yes.   Bimanual Exam:  Uterus:  normal size, contour, position, consistency, mobility, non-tender              Adnexa: normal adnexa and no mass, fullness, tenderness              Rectal exam: Yes.  .  Confirms.              Anus:  normal sphincter tone, no lesions  Chaperone was present for exam.  Assessment:   Well woman visit with normal exam. Remote hx abnormal pap.  HRT patient.  Microscopic hematuria.  Plan: Yearly mammogram recommended after age 46.  Patient will schedule. Recommended self breast exam.  Pap and HR HPV as above. Discussed Calcium, Vitamin D,  regular exercise program including cardiovascular and weight bearing exercise. Labs performed.  Yes.  .   See orders.  Hep C.  Urine micro and cx. Prescription medication(s) given.  Yes.  .  See orders.  ClimaraPro Rx for one year.  Discussed benefits and risks - DVT, PE, MI, stroke, and breast cancer. Follow up annually and prn.      After visit summary provided.

## 2016-05-08 LAB — URINALYSIS, MICROSCOPIC ONLY
BACTERIA UA: NONE SEEN [HPF]
CASTS: NONE SEEN [LPF]
CRYSTALS: NONE SEEN [HPF]
RBC / HPF: NONE SEEN RBC/HPF (ref <=2)
Squamous Epithelial / LPF: NONE SEEN [HPF] (ref <=5)
WBC, UA: NONE SEEN WBC/HPF (ref <=5)
YEAST: NONE SEEN [HPF]

## 2016-05-08 LAB — HEPATITIS C ANTIBODY: HCV AB: NEGATIVE

## 2016-05-08 LAB — URINE CULTURE

## 2016-05-09 LAB — IPS PAP TEST WITH HPV

## 2016-05-24 ENCOUNTER — Other Ambulatory Visit: Payer: Self-pay | Admitting: Obstetrics and Gynecology

## 2016-05-26 NOTE — Telephone Encounter (Signed)
Medication refill request: Climara  Last AEX:  05/07/16  Next AEX: 05/27/17 Dr. Edward JollySilva  Last MMG (if hormonal medication request): 04/26/15 BIRADS1:neg Refill authorized: 05/07/16 [facility]

## 2017-05-14 ENCOUNTER — Other Ambulatory Visit: Payer: Self-pay | Admitting: Obstetrics and Gynecology

## 2017-05-14 NOTE — Telephone Encounter (Signed)
I have attempted to contact this patient by phone with the following results: left message to return call to Danielle Shaffer at [phone] on answering machine (mobile per [facility]).  Name verified in voicemail. Advised call was regarding refill request for Climara. [phone] (Mobile)

## 2017-05-14 NOTE — Telephone Encounter (Signed)
[facility] Last filled - 05/07/16, #4 with 11 Refills Last AEX - 05/07/16 Next AEX - 05/27/17 Last MMG - 04/26/15, patient is not scheduled for follow up at [facility] at this time.  Please advise refills.

## 2017-05-27 ENCOUNTER — Ambulatory Visit: Payer: BLUE CROSS/BLUE SHIELD | Admitting: Obstetrics and Gynecology

## 2017-06-01 ENCOUNTER — Encounter: Payer: Self-pay | Admitting: Obstetrics and Gynecology

## 2017-06-01 ENCOUNTER — Ambulatory Visit (INDEPENDENT_AMBULATORY_CARE_PROVIDER_SITE_OTHER): Payer: BLUE CROSS/BLUE SHIELD | Admitting: Obstetrics and Gynecology

## 2017-06-01 VITALS — BP 118/72 | HR 80 | Resp 16 | Ht 63.25 in | Wt 138.0 lb

## 2017-06-01 DIAGNOSIS — Z01419 Encounter for gynecological examination (general) (routine) without abnormal findings: Secondary | ICD-10-CM

## 2017-06-01 NOTE — Patient Instructions (Addendum)
Please send me a copy of your wellness labs from work.    EXERCISE AND DIET:  We recommended that you start or continue a regular exercise program for good health. Regular exercise means any activity that makes your heart beat faster and makes you sweat.  We recommend exercising at least 30 minutes per day at least 3 days a week, preferably 4 or 5.  We also recommend a diet low in fat and sugar.  Inactivity, poor dietary choices and obesity can cause diabetes, heart attack, stroke, and kidney damage, among others.    ALCOHOL AND SMOKING:  Women should limit their alcohol intake to no more than 7 drinks/beers/glasses of wine (combined, not each!) per week. Moderation of alcohol intake to this level decreases your risk of breast cancer and liver damage. And of course, no recreational drugs are part of a healthy lifestyle.  And absolutely no smoking or even second hand smoke. Most people know smoking can cause heart and lung diseases, but did you know it also contributes to weakening of your bones? Aging of your skin?  Yellowing of your teeth and nails?  CALCIUM AND VITAMIN D:  Adequate intake of calcium and Vitamin D are recommended.  The recommendations for exact amounts of these supplements seem to change often, but generally speaking 600 mg of calcium (either carbonate or citrate) and 800 units of Vitamin D per day seems prudent. Certain women may benefit from higher intake of Vitamin D.  If you are among these women, your doctor will have told you during your visit.    PAP SMEARS:  Pap smears, to check for cervical cancer or precancers,  have traditionally been done yearly, although recent scientific advances have shown that most women can have pap smears less often.  However, every woman still should have a physical exam from her gynecologist every year. It will include a breast check, inspection of the vulva and vagina to check for abnormal growths or skin changes, a visual exam of the cervix, and then  an exam to evaluate the size and shape of the uterus and ovaries.  And after 58 years of age, a rectal exam is indicated to check for rectal cancers. We will also provide age appropriate advice regarding health maintenance, like when you should have certain vaccines, screening for sexually transmitted diseases, bone density testing, colonoscopy, mammograms, etc.   MAMMOGRAMS:  All women over 58 years old should have a yearly mammogram. Many facilities now offer a "3D" mammogram, which may cost around $50 extra out of pocket. If possible,  we recommend you accept the option to have the 3D mammogram performed.  It both reduces the number of women who will be called back for extra views which then turn out to be normal, and it is better than the routine mammogram at detecting truly abnormal areas.    COLONOSCOPY:  Colonoscopy to screen for colon cancer is recommended for all women at age 58.  We know, you hate the idea of the prep.  We agree, BUT, having colon cancer and not knowing it is worse!!  Colon cancer so often starts as a polyp that can be seen and removed at colonscopy, which can quite literally save your life!  And if your first colonoscopy is normal and you have no family history of colon cancer, most women don't have to have it again for 10 years.  Once every ten years, you can do something that may end up saving your life, right?  We will be happy to help you get it scheduled when you are ready.  Be sure to check your insurance coverage so you understand how much it will cost.  It may be covered as a preventative service at no cost, but you should check your particular policy.

## 2017-06-01 NOTE — Progress Notes (Signed)
56 y.o. G41P1011 Married Caucasian female here for annual exam.    Doing wellness labs at work.  States these are normal.  Still on HRT.  Would like to continue.   PCP:  No PCP  Patient's last menstrual period was 11/17/2005.           Sexually active: Yes.    The current method of family planning is post menopausal status.    Exercising: Yes.    tennis, cross training Smoker:  no  Health Maintenance: Pap:  05/07/16 Pap and HR HPV negative  04-14-13 ASCUS:Neg History of abnormal Pap:  Yes, Hx abnormal pap 1984 but no treatment to cervix.  04-14-13 ASCUS:Neg HR HPV MMG:  04-26-15 Density B/Neg/BiRads1:[facility] -- scheduled Thursday -- [place] Colonoscopy:  12/2015 normal with [facility];next due 12/2020 BMD:   n/a  Result  n/a TDaP:  04/14/13 Gardasil:   N/A Hep C: 05/07/16 Negative Screening Labs:  Labs done with Wellness program at work   reports that she has never smoked. She has never used smokeless tobacco. She reports that she drinks about 1.8 - 3.0 oz of alcohol per week . She reports that she does not use drugs.  History reviewed. No pertinent past medical history.  Past Surgical History:  Procedure Laterality Date  . WRIST FRACTURE SURGERY Left    has steel rod    Current Outpatient Prescriptions  Medication Sig Dispense Refill  . CALCIUM PO Take by mouth daily.    Marland Kitchen estradiol-levonorgestrel (CLIMARA PRO) 0.045-0.015 MG/DAY PLACE 1 PATCH ONTO THE SKIN ONCE A WEEK. 4 patch 11  . loratadine-pseudoephedrine (CLARITIN-D 24-HOUR) 10-240 MG per 24 hr tablet Take 1 tablet by mouth daily.    . Multiple Vitamin (THERA) TABS Take by mouth daily.     No current facility-administered medications for this visit.     Family History  Problem Relation Age of Onset  . Hyperlipidemia Mother   . Dementia Mother        Dec age 37  . Diabetes Father   . Heart attack Father     ROS:  Pertinent items are noted in HPI.  Otherwise, a comprehensive ROS was negative.  Exam:    BP 118/72 (BP Location: Right Arm, Patient Position: Sitting, Cuff Size: Normal)   Pulse 80   Resp 16   Ht 5' 3.25" (1.607 m)   Wt 138 lb (62.6 kg)   LMP 11/17/2005   BMI 24.25 kg/m     General appearance: alert, cooperative and appears stated age Head: Normocephalic, without obvious abnormality, atraumatic Neck: no adenopathy, supple, symmetrical, trachea midline and thyroid normal to inspection and palpation Lungs: clear to auscultation bilaterally Breasts: normal appearance, no masses or tenderness, No nipple retraction or dimpling, No nipple discharge or bleeding, No axillary or supraclavicular adenopathy Heart: regular rate and rhythm Abdomen: soft, non-tender; no masses, no organomegaly Extremities: extremities normal, atraumatic, no cyanosis or edema Skin: Skin color, texture, turgor normal. No rashes or lesions Lymph nodes: Cervical, supraclavicular, and axillary nodes normal. No abnormal inguinal nodes palpated Neurologic: Grossly normal  Pelvic: External genitalia:  no lesions              Urethra:  normal appearing urethra with no masses, tenderness or lesions              Bartholins and Skenes: normal                 Vagina: normal appearing vagina with normal color and  discharge, no lesions              Cervix: no lesions              Pap taken: No. Bimanual Exam:  Uterus:  normal size, contour, position, consistency, mobility, non-tender              Adnexa: no mass, fullness, tenderness              Rectal exam: Yes.  .  Confirms.              Anus:  normal sphincter tone, no lesions  Chaperone was present for exam.  Assessment:   Well woman visit with normal exam. HRT patient.  Overdue for mammogram.  Remote hx abnormal paps.   Plan: Mammogram screening discussed. Recommended self breast awareness. Pap and HR HPV as above. Guidelines for Calcium, Vitamin D, regular exercise program including cardiovascular and weight bearing exercise. We discussed the  WHI and risks of HRT. She will stop HRT and see how she does off of this. Follow up annually and prn.   After visit summary provided.

## 2017-06-01 NOTE — Telephone Encounter (Signed)
Screening mammogram discussed with patient at [facility] today. She will go off HRT for now.

## 2017-07-26 ENCOUNTER — Encounter: Payer: Self-pay | Admitting: Obstetrics and Gynecology

## 2017-07-27 ENCOUNTER — Telehealth: Payer: Self-pay | Admitting: *Deleted

## 2017-07-27 NOTE — Telephone Encounter (Signed)
I would recommend restarting the Climara pro patch and seeing if weaning down more slowly would diminish symptoms.  May need to see Dr. Edward JollySilva for follow-up.  Will cc to Dr. Edward JollySilva.

## 2017-07-27 NOTE — Telephone Encounter (Signed)
See telephone encounter dated 07/27/17. 

## 2017-07-27 NOTE — Telephone Encounter (Signed)
Spoke with patient in reference to [employer]Chart message. Reports stopping HRT, Climara Pro, first week of July 2018. Patient states she is aware it may take awhile for body to adjust and hot flashes to resolve, but has been 8 weeks now with no changes. Reports 8-10 hot flashes daily and trouble sleeping. Patient states she is miserable.  Patient asking for recommendations for OTC or herbal supplements to assist with symptoms?   Advised patient Dr. Edward JollySilva is out of the office, can review with covering provider and return call with recommendations, patient is agreeable.   Dr. Lesli AlbeeMiller-please review and advise?  Cc: Dr. Edward JollySilva        From Darleen Crockerobin G [place] To Patton SallesBrook E Amundson C Silva, MD Sent 07/26/2017 10:14 PM  Dr. Edward JollySilva,   I am still having issues with hot flashes. For example, today, Sunday September 9th, I have had at least 8-10 hot flashes. They are not minor, they are pretty intense. My entire body gets hot and my hair gets wet and my face is drips sweat. I know you told me to give it a while but they are not subsiding. Is there anything I can take over the counter or herbal that will help me. This is really miserable. Also, I am having issues sleeping.  My quality of life is pretty bad. Any help will be greatly appreciated.  you can reach me at [phone] if you need to ask any further questions or you can write me back here.    Thank you   Lorne Skeensobin Svec

## 2017-07-28 MED ORDER — ESTRADIOL-LEVONORGESTREL 0.045-0.015 MG/DAY TD PTWK: MEDICATED_PATCH | TRANSDERMAL | 0 refills | Status: DC

## 2017-07-28 NOTE — Telephone Encounter (Signed)
Spoke with patient, advised as seen below per Dr. Hyacinth MeekerMiller. RX for refill of Climara Pro #4/0RF sent to verified pharmacy on file. Patient scheduled for f/u with Dr. Edward JollySilva on 08/24/17 at 12:45pm. Patient verbalizes understanding and is agreeable.  Routing to provider for final review. Patient is agreeable to disposition. Will close encounter.  Cc: Dr. Edward JollySilva

## 2017-07-28 NOTE — Telephone Encounter (Signed)
Return call to Jill. °

## 2017-07-28 NOTE — Telephone Encounter (Signed)
Spoke with patient. Patient is not where she can talk, will return call to NanafaliaJill at [phone].

## 2017-08-11 ENCOUNTER — Encounter: Payer: Self-pay | Admitting: Obstetrics and Gynecology

## 2017-08-21 NOTE — Progress Notes (Signed)
GYNECOLOGY  VISIT   HPI: 56 y.o.   Married  Caucasian  female   G2P1011 with Patient's last menstrual period was 11/17/2005.   here for consult to discuss HRT.  Patient having hot flashes. She did begin Climara Pro in September and it helped.   Stopped her ClimaraPro for 8 weeks and starting having terrible hot flashes, and so she was restarted it back.  Had poor sleep quality and cannot function.   GYNECOLOGIC HISTORY: Patient's last menstrual period was 11/17/2005. Contraception:  Postmenopausal Menopausal hormone therapy:  Last mammogram:  06-11-17 Density B/Neg/BiRads1:[facility] Last pap smear: 05-07-16 Neg:Neg HR HPV                             04-14-13 ASCUS:Neg HR HPV OB History    Gravida Para Term Preterm AB Living   SAB TAB Ectopic Multiple Live Births   1                 There are no active problems to display for this patient.   No past medical history on file.  Past Surgical History:  Procedure Laterality Date  . WRIST FRACTURE SURGERY Left    has steel rod    Current Outpatient Prescriptions  Medication Sig Dispense Refill  . CALCIUM PO Take by mouth daily.    Marland Kitchen doxycycline (VIBRA-TABS) 100 MG tablet Take 100 mg by mouth daily.  0  . ibuprofen (ADVIL,MOTRIN) 800 MG tablet Take 1 tablet by mouth as needed.  2  . loratadine-pseudoephedrine (CLARITIN-D 24-HOUR) 10-240 MG per 24 hr tablet Take 1 tablet by mouth daily.    . Multiple Vitamin (THERA) TABS Take by mouth daily.    Marland Kitchen estradiol-levonorgestrel (CLIMARA PRO) 0.045-0.015 MG/DAY PLACE 1 PATCH ONTO THE SKIN ONCE A WEEK. (Patient not taking: Reported on 08/24/2017) 4 patch 0   No current facility-administered medications for this visit.      ALLERGIES: Bee venom  Family History  Problem Relation Age of Onset  . Hyperlipidemia Mother   . Dementia Mother        Dec age 60  . Diabetes Father   . Heart attack Father     Social History   Social History  . Marital status: Married     Spouse name: N/A  . Number of children: N/A  . Years of education: N/A   Occupational History  . Not on file.   Social History Main Topics  . Smoking status: Never Smoker  . Smokeless tobacco: Never Used  . Alcohol use 1.8 - 3.0 oz/week    3 - 5 Standard drinks or equivalent per week     Comment: 1-2 beers per week  . Drug use: No  . Sexual activity: Yes    Partners: Male    Birth control/ protection: Post-menopausal   Other Topics Concern  . Not on file   Social History Narrative  . No narrative on file    ROS:  Pertinent items are noted in HPI.  PHYSICAL EXAMINATION:    BP 132/80 (BP Location: Right Arm, Patient Position: Sitting, Cuff Size: Normal)   Pulse 70   Ht 5' 3.25" (1.607 m)   Wt 140 lb 12.8 oz (63.9 kg)   LMP 11/17/2005   BMI 24.74 kg/m     General appearance: alert, cooperative and appears stated age   ASSESSMENT  HRT patient.  Did not  tolerate stopping ClimaraPro.  PLAN  Stop ClimaraPro. We discussed potential risks of HRT - stroke, MI, DVT, PE, breast cancer, dementia.  Start Vivelle Dot 0.0375 mg twice weekly.  Start Prometrium 100 mg q hs.  She will call if she is having any problems or spotting.  Follow up for annual exam and prn.     An After Visit Summary was printed and given to the patient.  ___15___ minutes face to face time of which over 50% was spent in counseling.

## 2017-08-24 ENCOUNTER — Ambulatory Visit (INDEPENDENT_AMBULATORY_CARE_PROVIDER_SITE_OTHER): Payer: PRIVATE HEALTH INSURANCE | Admitting: Obstetrics and Gynecology

## 2017-08-24 ENCOUNTER — Encounter: Payer: Self-pay | Admitting: Obstetrics and Gynecology

## 2017-08-24 VITALS — BP 132/80 | HR 70 | Ht 63.25 in | Wt 140.8 lb

## 2017-08-24 DIAGNOSIS — N951 Menopausal and female climacteric states: Secondary | ICD-10-CM | POA: Diagnosis not present

## 2017-08-24 MED ORDER — PROGESTERONE MICRONIZED 100 MG PO CAPS: 100.0000 mg | ORAL_CAPSULE | Freq: Every day | ORAL | 9 refills | Status: DC

## 2017-08-24 MED ORDER — ESTRADIOL 0.0375 MG/24HR TD PTTW: 1.0000 | MEDICATED_PATCH | TRANSDERMAL | 9 refills | Status: DC

## 2018-06-02 ENCOUNTER — Ambulatory Visit: Payer: BLUE CROSS/BLUE SHIELD | Admitting: Obstetrics and Gynecology

## 2018-06-07 ENCOUNTER — Ambulatory Visit: Payer: PRIVATE HEALTH INSURANCE | Admitting: Obstetrics and Gynecology

## 2018-07-01 ENCOUNTER — Encounter: Payer: Self-pay | Admitting: Obstetrics and Gynecology

## 2018-07-01 ENCOUNTER — Ambulatory Visit: Payer: PRIVATE HEALTH INSURANCE | Admitting: Obstetrics and Gynecology

## 2018-07-01 ENCOUNTER — Other Ambulatory Visit: Payer: Self-pay

## 2018-07-01 VITALS — BP 112/62 | HR 68 | Resp 16 | Ht 63.75 in | Wt 140.0 lb

## 2018-07-01 DIAGNOSIS — Z01419 Encounter for gynecological examination (general) (routine) without abnormal findings: Secondary | ICD-10-CM

## 2018-07-01 NOTE — Patient Instructions (Signed)

## 2018-07-01 NOTE — Progress Notes (Signed)
57 y.o. 322P1011 Married Caucasian female here for annual exam.    On HRT.  Cutting back on her HRT.  Forgetting to change her patch.  Last time she used the patch was one month or more.  Uses the Prometrium once in a while for sleep.   Uses it once every month.  No vaginal bleeding or spotting.   Moved to a new home.  Daughter living at home again.   PCP:   None  Patient's last menstrual period was 11/17/2005.           Sexually active: Yes.    The current method of family planning is post menopausal status.    Exercising: Yes.    tennis, aerobics, weight training Smoker:  no  Health Maintenance: Pap:  05-07-16 Neg:Neg HR HPV           04-14-13 ASCUS:Neg HR HPV History of abnormal Pap:  Abnormal in 1984 but no tx.   MMG:  06/11/17 BIRADS 1 negative/density b.  She will schedule.  Colonoscopy:  12/2015 normal with [facility];next due 12/2020 BMD:   n/a  Result  n/a TDaP:  2014 Gardasil:   N/a HIV: neg during pregnancy Hep C: 05/07/16 Negative Screening Labs:  Wellness program at work every September.   reports that she has quit smoking. She has never used smokeless tobacco. She reports that she drinks about 3.0 - 4.0 standard drinks of alcohol per week. She reports that she does not use drugs.  History reviewed. No pertinent past medical history.  Past Surgical History:  Procedure Laterality Date  . WRIST FRACTURE SURGERY Left    has steel rod    Current Outpatient Medications  Medication Sig Dispense Refill  . CALCIUM PO Take by mouth daily.    Marland Kitchen. ibuprofen (ADVIL,MOTRIN) 800 MG tablet Take 1 tablet by mouth as needed.  2  . loratadine-pseudoephedrine (CLARITIN-D 24-HOUR) 10-240 MG per 24 hr tablet Take 1 tablet by mouth daily.    . Multiple Vitamin (THERA) TABS Take by mouth daily.     No current facility-administered medications for this visit.     Family History  Problem Relation Age of Onset  . Hyperlipidemia Mother   . Dementia Mother        Dec age 59  .  Diabetes Father   . Heart attack Father     Review of Systems  Constitutional: Negative.   HENT: Negative.   Eyes: Negative.   Respiratory: Negative.   Cardiovascular: Negative.   Gastrointestinal: Negative.   Endocrine: Negative.   Genitourinary: Negative.   Musculoskeletal: Negative.   Skin: Negative.   Allergic/Immunologic: Negative.   Neurological: Negative.   Hematological: Negative.   Psychiatric/Behavioral: Negative.     Exam:   BP 112/62   Pulse 68   Resp 16   Ht 5' 3.75" (1.619 m)   Wt 140 lb (63.5 kg)   LMP 11/17/2005   BMI 24.22 kg/m     General appearance: alert, cooperative and appears stated age Head: Normocephalic, without obvious abnormality, atraumatic Neck: no adenopathy, supple, symmetrical, trachea midline and thyroid normal to inspection and palpation Lungs: clear to auscultation bilaterally Breasts: normal appearance, no masses or tenderness, No nipple retraction or dimpling, No nipple discharge or bleeding, No axillary or supraclavicular adenopathy Heart: regular rate and rhythm Abdomen: soft, non-tender; no masses, no organomegaly Extremities: extremities normal, atraumatic, no cyanosis or edema Skin: Skin color, texture, turgor normal. No rashes or lesions Lymph nodes: Cervical, supraclavicular, and axillary nodes  normal. No abnormal inguinal nodes palpated Neurologic: Grossly normal  Pelvic: External genitalia:  no lesions              Urethra:  normal appearing urethra with no masses, tenderness or lesions              Bartholins and Skenes: normal                 Vagina: normal appearing vagina with normal color and discharge, no lesions              Cervix: no lesions              Pap taken: No. Bimanual Exam:  Uterus:  normal size, contour, position, consistency, mobility, non-tender              Adnexa: no mass, fullness, tenderness              Rectal exam: Yes.  .  Confirms.              Anus:  normal sphincter tone, no  lesions  Chaperone was present for exam.  Assessment:   Well woman visit with normal exam. Recently off HRT and doing well.   Plan: Mammogram screening.  She will schedule.  Recommended self breast awareness. Pap and HR HPV as above. Guidelines for Calcium, Vitamin D, regular exercise program including cardiovascular and weight bearing exercise. She will send me a copy of her labs from the wellness check at work.  She will stay off HRT now.  Follow up annually and prn.    After visit summary provided.

## 2019-05-13 ENCOUNTER — Encounter: Payer: Self-pay | Admitting: Obstetrics and Gynecology

## 2019-06-29 NOTE — Progress Notes (Signed)
58 y.o. G30P1011 Married Caucasian female here for annual exam.   Patient complaining of hot flashes.  Off HRT for one year.  Feeling miserable.  Wants to do something about it.  Used Paxil in the past and felt fatigued.   Labs at work.   Total cholesterol 140 with HDL 58, LDL 62, and TG 68.  Glucose 101.  A1C 4.4  PCP: None    Patient's last menstrual period was 11/17/2005.           Sexually active: Yes.    The current method of family planning is post menopausal status.    Exercising: Yes.    walking and tennis and some weights Smoker:  no  Health Maintenance: Pap:05-07-16 Neg:Neg HR HPV, 04-14-13 ASCUS:Neg HR HPV History of abnormal Pap:  Yes, 04-14-13 ASCUS:Neg HR HPV, abnormal in 1984 but no treatment. MMG: 6 weeks ago normal with [facility] Colonoscopy:  12/2015 normal;next due 12/2020 BMD:   n/a  Result  n/a TDaP:  2014 Gardasil:   n/a HIV:Neg during pregnancy Hep C:05-07-16 Neg Screening Labs: ---   reports that she has quit smoking. She has never used smokeless tobacco. She reports current alcohol use of about 2.0 - 3.0 standard drinks of alcohol per week. She reports that she does not use drugs.  History reviewed. No pertinent past medical history.  Past Surgical History:  Procedure Laterality Date  . WRIST FRACTURE SURGERY Left    has steel rod    Current Outpatient Medications  Medication Sig Dispense Refill  . CALCIUM PO Take by mouth daily.    Marland Kitchen ibuprofen (ADVIL,MOTRIN) 800 MG tablet Take 1 tablet by mouth as needed.  2  . loratadine-pseudoephedrine (CLARITIN-D 24-HOUR) 10-240 MG per 24 hr tablet Take 1 tablet by mouth daily.    . Multiple Vitamin (THERA) TABS Take by mouth daily.     No current facility-administered medications for this visit.     Family History  Problem Relation Age of Onset  . Hyperlipidemia Mother   . Dementia Mother        Dec age 60  . Diabetes Father   . Heart attack Father     Review of Systems  All other systems reviewed  and are negative.   Exam:   BP 116/68   Pulse 70   Temp 97.6 F (36.4 C) (Temporal)   Resp 16   Ht 5' 3.25" (1.607 m)   Wt 145 lb 3.2 oz (65.9 kg)   LMP 11/17/2005   BMI 25.52 kg/m     General appearance: alert, cooperative and appears stated age Head: normocephalic, without obvious abnormality, atraumatic Neck: no adenopathy, supple, symmetrical, trachea midline and thyroid normal to inspection and palpation Lungs: clear to auscultation bilaterally Breasts: normal appearance, no masses or tenderness, No nipple retraction or dimpling, No nipple discharge or bleeding, No axillary adenopathy Heart: regular rate and rhythm Abdomen: soft, non-tender; no masses, no organomegaly Extremities: extremities normal, atraumatic, no cyanosis or edema Skin: skin color, texture, turgor normal. No rashes or lesions Lymph nodes: cervical, supraclavicular, and axillary nodes normal. Neurologic: grossly normal  Pelvic: External genitalia:  no lesions              No abnormal inguinal nodes palpated.              Urethra:  normal appearing urethra with no masses, tenderness or lesions              Bartholins and Skenes: normal  Vagina: normal appearing vagina with normal color and discharge, no lesions              Cervix: no lesions              Pap taken: Yes.   Bimanual Exam:  Uterus:  normal size, contour, position, consistency, mobility, non-tender              Adnexa: no mass, fullness, tenderness              Rectal exam: Yes.  .  Confirms.              Anus:  normal sphincter tone, no lesions  Chaperone was present for exam.  Assessment:   Well woman visit with normal exam. Menopausal symptoms.  Hx ACUS pap.   Plan: Mammogram screening discussed. Self breast awareness reviewed. Pap and HR HPV as above. Guidelines for Calcium, Vitamin D, regular exercise program including cardiovascular and weight bearing exercise. Start Effexor XR 37.5 mg daily.   I discussed  risks and benefits.  Labs through work.  FU in 6 weeks for recheck on Effexor.  Follow up annually and prn.    After visit summary provided.

## 2019-06-30 ENCOUNTER — Other Ambulatory Visit: Payer: Self-pay

## 2019-07-04 ENCOUNTER — Other Ambulatory Visit: Payer: Self-pay

## 2019-07-04 ENCOUNTER — Other Ambulatory Visit (HOSPITAL_COMMUNITY)
Admission: RE | Admit: 2019-07-04 | Discharge: 2019-07-04 | Disposition: A | Discharge status: Home / Self Care | Payer: PRIVATE HEALTH INSURANCE | Source: Ambulatory Visit | Attending: Obstetrics and Gynecology | Admitting: Obstetrics and Gynecology

## 2019-07-04 ENCOUNTER — Ambulatory Visit (INDEPENDENT_AMBULATORY_CARE_PROVIDER_SITE_OTHER): Payer: PRIVATE HEALTH INSURANCE | Admitting: Obstetrics and Gynecology

## 2019-07-04 ENCOUNTER — Encounter: Payer: Self-pay | Admitting: Obstetrics and Gynecology

## 2019-07-04 VITALS — BP 116/68 | HR 70 | Temp 97.6°F | Resp 16 | Ht 63.25 in | Wt 145.2 lb

## 2019-07-04 DIAGNOSIS — Z01419 Encounter for gynecological examination (general) (routine) without abnormal findings: Secondary | ICD-10-CM

## 2019-07-04 MED ORDER — VENLAFAXINE HCL ER 37.5 MG PO CP24: 37.5000 mg | ORAL_CAPSULE | Freq: Every day | ORAL | 2 refills | Status: DC

## 2019-07-04 NOTE — Patient Instructions (Signed)
EXERCISE AND DIET:  We recommended that you start or continue a regular exercise program for good health. Regular exercise means any activity that makes your heart beat faster and makes you sweat.  We recommend exercising at least 30 minutes per day at least 3 days a week, preferably 4 or 5.  We also recommend a diet low in fat and sugar.  Inactivity, poor dietary choices and obesity can cause diabetes, heart attack, stroke, and kidney damage, among others.    ALCOHOL AND SMOKING:  Women should limit their alcohol intake to no more than 7 drinks/beers/glasses of wine (combined, not each!) per week. Moderation of alcohol intake to this level decreases your risk of breast cancer and liver damage. And of course, no recreational drugs are part of a healthy lifestyle.  And absolutely no smoking or even second hand smoke. Most people know smoking can cause heart and lung diseases, but did you know it also contributes to weakening of your bones? Aging of your skin?  Yellowing of your teeth and nails?  CALCIUM AND VITAMIN D:  Adequate intake of calcium and Vitamin D are recommended.  The recommendations for exact amounts of these supplements seem to change often, but generally speaking 600 mg of calcium (either carbonate or citrate) and 800 units of Vitamin D per day seems prudent. Certain women may benefit from higher intake of Vitamin D.  If you are among these women, your doctor will have told you during your visit.    PAP SMEARS:  Pap smears, to check for cervical cancer or precancers,  have traditionally been done yearly, although recent scientific advances have shown that most women can have pap smears less often.  However, every woman still should have a physical exam from her gynecologist every year. It will include a breast check, inspection of the vulva and vagina to check for abnormal growths or skin changes, a visual exam of the cervix, and then an exam to evaluate the size and shape of the uterus and  ovaries.  And after 60 years of age, a rectal exam is indicated to check for rectal cancers. We will also provide age appropriate advice regarding health maintenance, like when you should have certain vaccines, screening for sexually transmitted diseases, bone density testing, colonoscopy, mammograms, etc.   MAMMOGRAMS:  All women over 40 years old should have a yearly mammogram. Many facilities now offer a "3D" mammogram, which may cost around $50 extra out of pocket. If possible,  we recommend you accept the option to have the 3D mammogram performed.  It both reduces the number of women who will be called back for extra views which then turn out to be normal, and it is better than the routine mammogram at detecting truly abnormal areas.    COLONOSCOPY:  Colonoscopy to screen for colon cancer is recommended for all women at age 50.  We know, you hate the idea of the prep.  We agree, BUT, having colon cancer and not knowing it is worse!!  Colon cancer so often starts as a polyp that can be seen and removed at colonscopy, which can quite literally save your life!  And if your first colonoscopy is normal and you have no family history of colon cancer, most women don't have to have it again for 10 years.  Once every ten years, you can do something that may end up saving your life, right?  We will be happy to help you get it scheduled when you are ready.    Be sure to check your insurance coverage so you understand how much it will cost.  It may be covered as a preventative service at no cost, but you should check your particular policy.     Venlafaxine extended-release capsules What is this medicine? VENLAFAXINE(VEN la fax een) is used to treat depression, anxiety and panic disorder. This medicine may be used for other purposes; ask your health care provider or pharmacist if you have questions. COMMON BRAND NAME(S): Effexor XR What should I tell my health care provider before I take this medicine? They need  to know if you have any of these conditions:  bleeding disorders  glaucoma  heart disease  high blood pressure  high cholesterol  kidney disease  liver disease  low levels of sodium in the blood  mania or bipolar disorder  seizures  suicidal thoughts, plans, or attempt; a previous suicide attempt by you or a family  take medicines that treat or prevent blood clots  thyroid disease  an unusual or allergic reaction to venlafaxine, desvenlafaxine, other medicines, foods, dyes, or preservatives  pregnant or trying to get pregnant  breast-feeding How should I use this medicine? Take this medicine by mouth with a full glass of water. Follow the directions on the prescription label. Do not cut, crush, or chew this medicine. Take it with food. If needed, the capsule may be carefully opened and the entire contents sprinkled on a spoonful of cool applesauce. Swallow the applesauce/pellet mixture right away without chewing and follow with a glass of water to ensure complete swallowing of the pellets. Try to take your medicine at about the same time each day. Do not take your medicine more often than directed. Do not stop taking this medicine suddenly except upon the advice of your doctor. Stopping this medicine too quickly may cause serious side effects or your condition may worsen. A special MedGuide will be given to you by the pharmacist with each prescription and refill. Be sure to read this information carefully each time. Talk to your pediatrician regarding the use of this medicine in children. Special care may be needed. Overdosage: If you think you have taken too much of this medicine contact a poison control center or emergency room at once. NOTE: This medicine is only for you. Do not share this medicine with others. What if I miss a dose? If you miss a dose, take it as soon as you can. If it is almost time for your next dose, take only that dose. Do not take double or extra  doses. What may interact with this medicine? Do not take this medicine with any of the following medications:  certain medicines for fungal infections like fluconazole, itraconazole, ketoconazole, posaconazole, voriconazole  cisapride  desvenlafaxine  dronedarone  duloxetine  levomilnacipran  linezolid  MAOIs like Carbex, Eldepryl, Marplan, Nardil, and Parnate  methylene blue (injected into a vein)  milnacipran  pimozide  thioridazine This medicine may also interact with the following medications:  amphetamines  aspirin and aspirin-like medicines  certain medicines for depression, anxiety, or psychotic disturbances  certain medicines for migraine headaches like almotriptan, eletriptan, frovatriptan, naratriptan, rizatriptan, sumatriptan, zolmitriptan  certain medicines for sleep  certain medicines that treat or prevent blood clots like dalteparin, enoxaparin, warfarin  cimetidine  clozapine  diuretics  fentanyl  furazolidone  indinavir  isoniazid  lithium  metoprolol  NSAIDS, medicines for pain and inflammation, like ibuprofen or naproxen  other medicines that prolong the QT interval (cause an abnormal heart rhythm)   like dofetilide, ziprasidone  procarbazine  rasagiline  supplements like St. John's wort, kava kava, valerian  tramadol  tryptophan This list may not describe all possible interactions. Give your health care provider a list of all the medicines, herbs, non-prescription drugs, or dietary supplements you use. Also tell them if you smoke, drink alcohol, or use illegal drugs. Some items may interact with your medicine. What should I watch for while using this medicine? Tell your doctor if your symptoms do not get better or if they get worse. Visit your doctor or health care professional for regular checks on your progress. Because it may take several weeks to see the full effects of this medicine, it is important to continue your  treatment as prescribed by your doctor. Patients and their families should watch out for new or worsening thoughts of suicide or depression. Also watch out for sudden changes in feelings such as feeling anxious, agitated, panicky, irritable, hostile, aggressive, impulsive, severely restless, overly excited and hyperactive, or not being able to sleep. If this happens, especially at the beginning of treatment or after a change in dose, call your health care professional. This medicine can cause an increase in blood pressure. Check with your doctor for instructions on monitoring your blood pressure while taking this medicine. You may get drowsy or dizzy. Do not drive, use machinery, or do anything that needs mental alertness until you know how this medicine affects you. Do not stand or sit up quickly, especially if you are an older patient. This reduces the risk of dizzy or fainting spells. Alcohol may interfere with the effect of this medicine. Avoid alcoholic drinks. Your mouth may get dry. Chewing sugarless gum, sucking hard candy and drinking plenty of water will help. Contact your doctor if the problem does not go away or is severe. What side effects may I notice from receiving this medicine? Side effects that you should report to your doctor or health care professional as soon as possible:  allergic reactions like skin rash, itching or hives, swelling of the face, lips, or tongue  anxious  breathing problems  confusion  changes in vision  chest pain  confusion  elevated mood, decreased need for sleep, racing thoughts, impulsive behavior  eye pain  fast, irregular heartbeat  feeling faint or lightheaded, falls  feeling agitated, angry, or irritable  hallucination, loss of contact with reality  high blood pressure  loss of balance or coordination  palpitations  redness, blistering, peeling or loosening of the skin, including inside the mouth  restlessness, pacing, inability  to keep still  seizures  stiff muscles  suicidal thoughts or other mood changes  trouble passing urine or change in the amount of urine  trouble sleeping  unusual bleeding or bruising  unusually weak or tired  vomiting Side effects that usually do not require medical attention (report to your doctor or health care professional if they continue or are bothersome):  change in sex drive or performance  change in appetite or weight  constipation  dizziness  dry mouth  headache  increased sweating  nausea  tired This list may not describe all possible side effects. Call your doctor for medical advice about side effects. You may report side effects to FDA at 1-800-FDA-1088. Where should I keep my medicine? Keep out of the reach of children. Store at a controlled temperature between 20 and 25 degrees C (68 degrees and 77 degrees F), in a dry place. Throw away any unused medicine after the expiration   date. NOTE: This sheet is a summary. It may not cover all possible information. If you have questions about this medicine, talk to your doctor, pharmacist, or health care provider.  2020 Elsevier/Gold Standard (2018-10-26 12:06:43)  

## 2019-07-06 LAB — CYTOLOGY - PAP
Diagnosis: NEGATIVE
HPV: NOT DETECTED

## 2019-08-15 ENCOUNTER — Telehealth (INDEPENDENT_AMBULATORY_CARE_PROVIDER_SITE_OTHER): Payer: PRIVATE HEALTH INSURANCE | Admitting: Obstetrics and Gynecology

## 2019-08-15 ENCOUNTER — Encounter: Payer: Self-pay | Admitting: Obstetrics and Gynecology

## 2019-08-15 ENCOUNTER — Other Ambulatory Visit: Payer: Self-pay

## 2019-08-15 VITALS — BP 130/72 | HR 84 | Temp 97.9°F | Ht 63.25 in | Wt 148.0 lb

## 2019-08-15 DIAGNOSIS — N951 Menopausal and female climacteric states: Secondary | ICD-10-CM

## 2019-08-15 DIAGNOSIS — Z5181 Encounter for therapeutic drug level monitoring: Secondary | ICD-10-CM

## 2019-08-15 MED ORDER — GABAPENTIN 100 MG PO CAPS: 100.0000 mg | ORAL_CAPSULE | Freq: Every day | ORAL | 1 refills | Status: DC

## 2019-08-15 NOTE — Patient Instructions (Signed)

## 2019-08-15 NOTE — Progress Notes (Signed)
GYNECOLOGY  VISIT   HPI: 58 y.o.   Married  Caucasian  female   G2P1011 with Patient's last menstrual period was 11/17/2005. here for  6 week follow up after starting Effexor.  Patient complaining of headaches and insomnia since beginning Effexor. Her hot flashes are better.   She felt sleepy in the past with Paxil.  GYNECOLOGIC HISTORY: Patient's last menstrual period was 11/17/2005. Contraception:  Postmenopausal Menopausal hormone therapy:  none Last mammogram: 05-13-19 Neg/density B/BiRads1 Last pap smear:07-04-19 Neg:neg HR HPV,05-07-16 Neg:Neg HR HPV         OB History    Gravida  2   Para  1   Term  1   Preterm      AB  1   Living  1     SAB  1   TAB      Ectopic      Multiple      Live Births                 There are no active problems to display for this patient.   History reviewed. No pertinent past medical history.  Past Surgical History:  Procedure Laterality Date  . WRIST FRACTURE SURGERY Left    has steel rod    Current Outpatient Medications  Medication Sig Dispense Refill  . CALCIUM PO Take by mouth daily.    Marland Kitchen ibuprofen (ADVIL,MOTRIN) 800 MG tablet Take 1 tablet by mouth as needed.  2  . loratadine-pseudoephedrine (CLARITIN-D 24-HOUR) 10-240 MG per 24 hr tablet Take 1 tablet by mouth daily.    . Multiple Vitamin (THERA) TABS Take by mouth daily.    Marland Kitchen venlafaxine XR (EFFEXOR XR) 37.5 MG 24 hr capsule Take 1 capsule (37.5 mg total) by mouth daily. 30 capsule 2   No current facility-administered medications for this visit.      ALLERGIES: Bee venom  Family History  Problem Relation Age of Onset  . Hyperlipidemia Mother   . Dementia Mother        Dec age 71  . Diabetes Father   . Heart attack Father     Social History   Socioeconomic History  . Marital status: Married    Spouse name: Not on file  . Number of children: Not on file  . Years of education: Not on file  . Highest education level: Not on file  Occupational  History  . Not on file  Social Needs  . Financial resource strain: Not on file  . Food insecurity    Worry: Not on file    Inability: Not on file  . Transportation needs    Medical: Not on file    Non-medical: Not on file  Tobacco Use  . Smoking status: Former Games developer  . Smokeless tobacco: Never Used  Substance and Sexual Activity  . Alcohol use: Yes    Alcohol/week: 2.0 - 3.0 standard drinks    Types: 2 - 3 Standard drinks or equivalent per week  . Drug use: No  . Sexual activity: Yes    Partners: Male    Birth control/protection: Post-menopausal  Lifestyle  . Physical activity    Days per week: Not on file    Minutes per session: Not on file  . Stress: Not on file  Relationships  . Social Musician on phone: Not on file    Gets together: Not on file    Attends religious service: Not on file    Active  member of club or organization: Not on file    Attends meetings of clubs or organizations: Not on file    Relationship status: Not on file  . Intimate partner violence    Fear of current or ex partner: Not on file    Emotionally abused: Not on file    Physically abused: Not on file    Forced sexual activity: Not on file  Other Topics Concern  . Not on file  Social History Narrative  . Not on file    Review of Systems  Neurological: Positive for headaches.  All other systems reviewed and are negative.   PHYSICAL EXAMINATION:    BP 130/72   Pulse 84   Temp 97.9 F (36.6 C) (Temporal)   Ht 5' 3.25" (1.607 m)   Wt 148 lb (67.1 kg)   LMP 11/17/2005   BMI 26.01 kg/m     General appearance: alert, cooperative and appears stated age   ASSESSMENT  Menopausal symptoms.  Side effects from Effexor.   PLAN  Stop Effexor.  Start Neurontin after about 5 days.  We reviewed potential side effects of Neurontin from [facility].  She will call me back in about 6 weeks.   An After Visit Summary was printed and given to the patient.  _15_____ minutes face  to face time of which over 50% was spent in counseling.

## 2019-09-27 ENCOUNTER — Telehealth (INDEPENDENT_AMBULATORY_CARE_PROVIDER_SITE_OTHER): Payer: PRIVATE HEALTH INSURANCE | Admitting: Obstetrics and Gynecology

## 2019-09-27 DIAGNOSIS — Z5181 Encounter for therapeutic drug level monitoring: Secondary | ICD-10-CM

## 2019-09-27 DIAGNOSIS — N951 Menopausal and female climacteric states: Secondary | ICD-10-CM | POA: Diagnosis not present

## 2019-09-27 MED ORDER — GABAPENTIN 100 MG PO CAPS: ORAL_CAPSULE | ORAL | 8 refills | Status: DC

## 2019-09-27 NOTE — Progress Notes (Signed)
GYNECOLOGY  VISIT   HPI: 58 y.o.   Married  Caucasian  female   G2P1011 with Patient's last menstrual period was 11/17/2005.   here for 6 week follow-up via televisit. Patient states Gabapentin working well.  She gives permission for the visit, arranged by Para March.  I am in my office.  She is at her office at work. Started at 4:18. Ended at 4:25.  Takes Gabapentin 200 - 300 mg at hs.  300 can be too much and cause grogginess.  No hot flashes at night or during the day.   Working on weight loss.   GYNECOLOGIC HISTORY: Patient's last menstrual period was 11/17/2005. Contraception: postmenopausal Menopausal hormone therapy:  none Last mammogram: : 05-13-19 Neg/density B/BiRads1 Last pap smear: :07-04-19 Neg:neg HR HPV,05-07-16 Neg:Neg HR HPV         OB History    Gravida  2   Para  1   Term  1   Preterm      AB  1   Living  1     SAB  1   TAB      Ectopic      Multiple      Live Births                 There are no active problems to display for this patient.   No past medical history on file.  Past Surgical History:  Procedure Laterality Date  . WRIST FRACTURE SURGERY Left    has steel rod    Current Outpatient Medications  Medication Sig Dispense Refill  . CALCIUM PO Take by mouth daily.    Marland Kitchen gabapentin (NEURONTIN) 100 MG capsule Take 1 capsule (100 mg total) by mouth at bedtime. Increase to 2 capsules (200 mg) by mouth at bedtime after one week.  Then increase to 3 capsules (300 mg) bu mouth at bedtime. 90 capsule 1  . ibuprofen (ADVIL,MOTRIN) 800 MG tablet Take 1 tablet by mouth as needed.  2  . loratadine-pseudoephedrine (CLARITIN-D 24-HOUR) 10-240 MG per 24 hr tablet Take 1 tablet by mouth daily.    . Multiple Vitamin (THERA) TABS Take by mouth daily.     No current facility-administered medications for this visit.      ALLERGIES: Bee venom  Family History  Problem Relation Age of Onset  . Hyperlipidemia Mother   . Dementia  Mother        Dec age 93  . Diabetes Father   . Heart attack Father     Social History   Socioeconomic History  . Marital status: Married    Spouse name: Not on file  . Number of children: Not on file  . Years of education: Not on file  . Highest education level: Not on file  Occupational History  . Not on file  Social Needs  . Financial resource strain: Not on file  . Food insecurity    Worry: Not on file    Inability: Not on file  . Transportation needs    Medical: Not on file    Non-medical: Not on file  Tobacco Use  . Smoking status: Former Research scientist (life sciences)  . Smokeless tobacco: Never Used  Substance and Sexual Activity  . Alcohol use: Yes    Alcohol/week: 2.0 - 3.0 standard drinks    Types: 2 - 3 Standard drinks or equivalent per week  . Drug use: No  . Sexual activity: Yes    Partners: Male    Birth control/protection:  Post-menopausal  Lifestyle  . Physical activity    Days per week: Not on file    Minutes per session: Not on file  . Stress: Not on file  Relationships  . Social Musician on phone: Not on file    Gets together: Not on file    Attends religious service: Not on file    Active member of club or organization: Not on file    Attends meetings of clubs or organizations: Not on file    Relationship status: Not on file  . Intimate partner violence    Fear of current or ex partner: Not on file    Emotionally abused: Not on file    Physically abused: Not on file    Forced sexual activity: Not on file  Other Topics Concern  . Not on file  Social History Narrative  . Not on file    Review of Systems  PHYSICAL EXAMINATION:    LMP 11/17/2005     General appearance: alert, cooperative and appears stated age  ASSESSMENT  Menopausal symptoms.  Medication monitoring.  Desire for weight loss.  PLAN  We discussed Gabapentin and potential side effects including weight gain.  She will continue with Gabapentin 200 mg q hs.  FU for annual exam  and prn.    An After Visit Summary was printed and given to the patient.  __7____ minutes face to face time of which over 50% was spent in counseling.

## 2019-09-30 ENCOUNTER — Encounter: Payer: Self-pay | Admitting: Obstetrics and Gynecology

## 2019-10-11 ENCOUNTER — Encounter: Payer: Self-pay | Admitting: Obstetrics and Gynecology

## 2019-10-22 ENCOUNTER — Other Ambulatory Visit: Payer: Self-pay | Admitting: Obstetrics and Gynecology

## 2020-01-09 ENCOUNTER — Telehealth: Payer: Self-pay | Admitting: Obstetrics and Gynecology

## 2020-01-09 NOTE — Telephone Encounter (Signed)
Patient due for refill on gabapentin Fri 2/26. Leaving on vacation 7 AM Thursday 2/25 and would like authorization to pick up Wednesday night. Patient requests call to let her know if it is authorized. Pharmacy and phone number on file.

## 2020-01-09 NOTE — Telephone Encounter (Signed)
Spoke to patient who states the pharmacist told her that she couldn't get her rx a day or 2 early even though she had refills. I spoke with ashley at the pharmacy who said she shouldn't have a problem. Patient aware this usually has to go thru her insurance. Patient to call pharmacy back & will call us if any problems.

## 2020-01-10 NOTE — Telephone Encounter (Signed)
Left a message for patient to call me back if she had any problems getting her medication

## 2020-01-11 NOTE — Telephone Encounter (Signed)
No call back from patient, encounter closed

## 2020-07-03 NOTE — Progress Notes (Signed)
59 y.o. G90P1011 Married Caucasian female here for annual exam.    Taking Gabapentin 200 mg at hs.  This controls her hot flashes.  Sleep quality is fair.   Some dryness with intercourse.   Daughter getting married in November.   Not vaccinated against Covid.  Wants more information about the vaccine before taking it.   PCP:  None   Patient's last menstrual period was 11/17/2005.           Sexually active: Yes.    The current method of family planning is post menopausal status.    Exercising: Yes.    crossfit 3x/week and tennis Smoker:  no  Health Maintenance: Pap: 07-04-19 Neg:Neg HR HPV, 05-07-16 Neg:Neg HR HPV, 04-14-13 ASCUS:Neg HR HPV History of abnormal Pap:  Yes, 04-14-13 ASCUS:Neg HR HPV, abnormal in 1984 but no treatment. MMG:  05-13-19 Neg/density B/BiRads1--[facility]--pt.knows to schedule Colonoscopy: 12/2015 normal;next due 12/2020 BMD:   n/a  Result  n/a TDaP:  04-14-13 Gardasil:   no HIV: Neg during preg Hep C: 05-07-16 Neg Screening Labs:  Brings in today.  Will do it next month at work.    reports that she has quit smoking. She has never used smokeless tobacco. She reports current alcohol use of about 2.0 - 3.0 standard drinks of alcohol per week. She reports that she does not use drugs.  History reviewed. No pertinent past medical history.  Past Surgical History:  Procedure Laterality Date   WRIST FRACTURE SURGERY Left    has steel rod    Current Outpatient Medications  Medication Sig Dispense Refill   CALCIUM PO Take by mouth daily.     gabapentin (NEURONTIN) 100 MG capsule Take 2 capsules (200 mg) by mouth at bedtime. 60 capsule 8   ibuprofen (ADVIL,MOTRIN) 800 MG tablet Take 1 tablet by mouth as needed.  2   loratadine-pseudoephedrine (CLARITIN-D 24-HOUR) 10-240 MG per 24 hr tablet Take 1 tablet by mouth daily.     Multiple Vitamin (THERA) TABS Take by mouth daily.     No current facility-administered medications for this visit.    Family History   Problem Relation Age of Onset   Hyperlipidemia Mother    Dementia Mother        Dec age 73   Diabetes Father    Heart attack Father     Review of Systems  All other systems reviewed and are negative.   Exam:   BP 128/74    Pulse 88    Resp 16    Ht 5\' 3"  (1.6 m)    Wt 140 lb (63.5 kg)    LMP 11/17/2005    BMI 24.80 kg/m     General appearance: alert, cooperative and appears stated age Head: normocephalic, without obvious abnormality, atraumatic Neck: no adenopathy, supple, symmetrical, trachea midline and thyroid normal to inspection and palpation Lungs: clear to auscultation bilaterally Breasts: normal appearance, no masses or tenderness, No nipple retraction or dimpling, No nipple discharge or bleeding, No axillary adenopathy Heart: regular rate and rhythm Abdomen: soft, non-tender; no masses, no organomegaly Extremities: extremities normal, atraumatic, no cyanosis or edema Skin: skin color, texture, turgor normal. No rashes or lesions Lymph nodes: cervical, supraclavicular, and axillary nodes normal. Neurologic: grossly normal  Pelvic: External genitalia:  no lesions              No abnormal inguinal nodes palpated.              Urethra:  normal appearing urethra with no  masses, tenderness or lesions              Bartholins and Skenes: normal                 Vagina: normal appearing vagina with normal color and discharge, no lesions              Cervix: no lesions              Pap taken: No. Bimanual Exam:  Uterus:  normal size, contour, position, consistency, mobility, non-tender              Adnexa: no mass, fullness, tenderness              Rectal exam: Yes.  .  Confirms.              Anus:  normal sphincter tone, no lesions  Chaperone was present for exam.  Assessment:   Well woman visit with normal exam. Menopausal symptoms controlled with gabapentin.   Plan: Mammogram screening discussed. Self breast awareness reviewed. Pap and HR HPV as  above. Guidelines for Calcium, Vitamin D, regular exercise program including cardiovascular and weight bearing exercise. Refill Gabapentin 200 mg q hs for one year.  Patient brings in lab results today that will be scanned in to Epic.  Follow up annually and prn.   After visit summary provided.

## 2020-07-04 ENCOUNTER — Encounter: Payer: Self-pay | Admitting: Obstetrics and Gynecology

## 2020-07-04 ENCOUNTER — Ambulatory Visit: Payer: PRIVATE HEALTH INSURANCE | Admitting: Obstetrics and Gynecology

## 2020-07-04 ENCOUNTER — Other Ambulatory Visit: Payer: Self-pay

## 2020-07-04 VITALS — BP 128/74 | HR 88 | Resp 16 | Ht 63.0 in | Wt 140.0 lb

## 2020-07-04 DIAGNOSIS — Z01419 Encounter for gynecological examination (general) (routine) without abnormal findings: Secondary | ICD-10-CM | POA: Diagnosis not present

## 2020-07-04 MED ORDER — GABAPENTIN 100 MG PO CAPS
ORAL_CAPSULE | ORAL | 11 refills | Status: DC
Start: 2020-07-04 — End: 2021-07-30

## 2020-07-04 NOTE — Patient Instructions (Signed)

## 2020-08-12 ENCOUNTER — Other Ambulatory Visit: Payer: Self-pay | Admitting: Obstetrics and Gynecology

## 2021-07-09 ENCOUNTER — Ambulatory Visit: Payer: PRIVATE HEALTH INSURANCE | Admitting: Obstetrics and Gynecology

## 2021-07-29 ENCOUNTER — Other Ambulatory Visit: Payer: Self-pay | Admitting: Obstetrics and Gynecology

## 2021-07-29 NOTE — Telephone Encounter (Signed)
RX refill for Gabapentin  Last annual 07/04/2020 I will send this to Provider for Approval/denial

## 2021-09-27 ENCOUNTER — Other Ambulatory Visit: Payer: Self-pay | Admitting: Obstetrics and Gynecology
